# Patient Record
Sex: Male | Born: 1960
Health system: Southern US, Community
[De-identification: ages and names within clinical notes are randomized; demographics above are authoritative.]

## PROBLEM LIST (undated history)

## (undated) DIAGNOSIS — U071 COVID-19: Secondary | ICD-10-CM

## (undated) DIAGNOSIS — F32A Depression, unspecified: Secondary | ICD-10-CM

## (undated) DIAGNOSIS — T7840XA Allergy, unspecified, initial encounter: Secondary | ICD-10-CM

## (undated) DIAGNOSIS — J189 Pneumonia, unspecified organism: Secondary | ICD-10-CM

## (undated) HISTORY — DX: Depression, unspecified: F32.A

## (undated) HISTORY — DX: Allergy, unspecified, initial encounter: T78.40XA

## (undated) HISTORY — DX: COVID-19: U07.1

---

## 2008-03-03 ENCOUNTER — Emergency Department (HOSPITAL_COMMUNITY): Admission: EM | Admit: 2008-03-03 | Discharge: 2008-03-03 | Payer: Self-pay | Admitting: Emergency Medicine

## 2020-02-04 ENCOUNTER — Inpatient Hospital Stay (HOSPITAL_COMMUNITY)
Admission: EM | Admit: 2020-02-04 | Discharge: 2020-02-15 | DRG: 177 | Disposition: A | Discharge status: Home / Self Care | Payer: HRSA Program | Attending: Student in an Organized Health Care Education/Training Program | Admitting: Student in an Organized Health Care Education/Training Program

## 2020-02-04 ENCOUNTER — Emergency Department (HOSPITAL_COMMUNITY): Payer: HRSA Program

## 2020-02-04 ENCOUNTER — Other Ambulatory Visit: Payer: Self-pay

## 2020-02-04 DIAGNOSIS — Z801 Family history of malignant neoplasm of trachea, bronchus and lung: Secondary | ICD-10-CM

## 2020-02-04 DIAGNOSIS — N179 Acute kidney failure, unspecified: Secondary | ICD-10-CM | POA: Diagnosis present

## 2020-02-04 DIAGNOSIS — J1282 Pneumonia due to coronavirus disease 2019: Secondary | ICD-10-CM | POA: Diagnosis present

## 2020-02-04 DIAGNOSIS — Z283 Underimmunization status: Secondary | ICD-10-CM

## 2020-02-04 DIAGNOSIS — R0902 Hypoxemia: Secondary | ICD-10-CM

## 2020-02-04 DIAGNOSIS — E1165 Type 2 diabetes mellitus with hyperglycemia: Secondary | ICD-10-CM | POA: Diagnosis present

## 2020-02-04 DIAGNOSIS — E860 Dehydration: Secondary | ICD-10-CM | POA: Diagnosis present

## 2020-02-04 DIAGNOSIS — U071 COVID-19: Principal | ICD-10-CM | POA: Diagnosis present

## 2020-02-04 DIAGNOSIS — R1110 Vomiting, unspecified: Secondary | ICD-10-CM | POA: Diagnosis present

## 2020-02-04 DIAGNOSIS — Z803 Family history of malignant neoplasm of breast: Secondary | ICD-10-CM

## 2020-02-04 DIAGNOSIS — E119 Type 2 diabetes mellitus without complications: Secondary | ICD-10-CM

## 2020-02-04 DIAGNOSIS — T380X5A Adverse effect of glucocorticoids and synthetic analogues, initial encounter: Secondary | ICD-10-CM | POA: Diagnosis not present

## 2020-02-04 DIAGNOSIS — K5900 Constipation, unspecified: Secondary | ICD-10-CM | POA: Diagnosis not present

## 2020-02-04 DIAGNOSIS — E861 Hypovolemia: Secondary | ICD-10-CM | POA: Diagnosis present

## 2020-02-04 DIAGNOSIS — J9601 Acute respiratory failure with hypoxia: Secondary | ICD-10-CM | POA: Diagnosis present

## 2020-02-04 LAB — BASIC METABOLIC PANEL
Anion gap: 9 (ref 5–15)
BUN: 17 mg/dL (ref 6–20)
CO2: 24 mmol/L (ref 22–32)
Calcium: 8.6 mg/dL — ABNORMAL LOW (ref 8.9–10.3)
Chloride: 98 mmol/L (ref 98–111)
Creatinine, Ser: 1.25 mg/dL — ABNORMAL HIGH (ref 0.61–1.24)
GFR, Estimated: >60 mL/min (ref >60)
Glucose, Bld: 154 mg/dL — ABNORMAL HIGH (ref 70–99)
Potassium: 4.1 mmol/L (ref 3.5–5.1)
Sodium: 131 mmol/L — ABNORMAL LOW (ref 135–145)

## 2020-02-04 LAB — LACTATE DEHYDROGENASE: LDH: 273 U/L — ABNORMAL HIGH (ref 98–192)

## 2020-02-04 LAB — C-REACTIVE PROTEIN: CRP: 1.6 mg/dL — ABNORMAL HIGH (ref <1.0)

## 2020-02-04 LAB — CBC
HCT: 47.6 % (ref 39.0–52.0)
Hemoglobin: 16.5 g/dL (ref 13.0–17.0)
MCH: 30.6 pg (ref 26.0–34.0)
MCHC: 34.7 g/dL (ref 30.0–36.0)
MCV: 88.1 fL (ref 80.0–100.0)
Platelets: 102 10*3/uL — ABNORMAL LOW (ref 150–400)
RBC: 5.4 MIL/uL (ref 4.22–5.81)
RDW: 12 % (ref 11.5–15.5)
WBC: 3.4 10*3/uL — ABNORMAL LOW (ref 4.0–10.5)
nRBC: 0 % (ref 0.0–0.2)

## 2020-02-04 LAB — D-DIMER, QUANTITATIVE: D-Dimer, Quant: 0.51 ug/mL-FEU — ABNORMAL HIGH (ref 0.00–0.50)

## 2020-02-04 LAB — PROCALCITONIN: Procalcitonin: <0.1 ng/mL

## 2020-02-04 LAB — FIBRINOGEN: Fibrinogen: 596 mg/dL — ABNORMAL HIGH (ref 210–475)

## 2020-02-04 LAB — RESP PANEL BY RT-PCR (FLU A&B, COVID) ARPGX2
Influenza A by PCR: NEGATIVE
Influenza B by PCR: NEGATIVE
SARS Coronavirus 2 by RT PCR: POSITIVE — AB

## 2020-02-04 LAB — LACTIC ACID, PLASMA: Lactic Acid, Venous: 2 mmol/L (ref 0.5–1.9)

## 2020-02-04 LAB — FERRITIN: Ferritin: 1266 ng/mL — ABNORMAL HIGH (ref 24–336)

## 2020-02-04 LAB — TRIGLYCERIDES: Triglycerides: 88 mg/dL (ref <150)

## 2020-02-04 MED ORDER — ONDANSETRON HCL 4 MG/2ML IJ SOLN
4.0000 mg | Freq: Once | INTRAMUSCULAR | Status: AC
  Administered 2020-02-04: 4 mg via INTRAVENOUS
  Filled 2020-02-04: qty 2

## 2020-02-04 MED ORDER — POLYETHYLENE GLYCOL 3350 17 G PO PACK
17.0000 g | PACK | Freq: Every day | ORAL | Status: DC | PRN
  Administered 2020-02-10: 17 g via ORAL
  Filled 2020-02-04 (×2): qty 1

## 2020-02-04 MED ORDER — ACETAMINOPHEN 325 MG PO TABS
650.0000 mg | ORAL_TABLET | Freq: Once | ORAL | Status: AC
  Administered 2020-02-04: 650 mg via ORAL

## 2020-02-04 MED ORDER — LACTATED RINGERS IV SOLN: INTRAVENOUS | Status: AC

## 2020-02-04 MED ORDER — DEXAMETHASONE 6 MG PO TABS
6.0000 mg | ORAL_TABLET | Freq: Every day | ORAL | Status: AC
  Administered 2020-02-04 – 2020-02-13 (×10): 6 mg via ORAL
  Filled 2020-02-04: qty 2
  Filled 2020-02-04 (×4): qty 1
  Filled 2020-02-04: qty 2
  Filled 2020-02-04 (×4): qty 1

## 2020-02-04 MED ORDER — ACETAMINOPHEN 325 MG PO TABS
650.0000 mg | ORAL_TABLET | Freq: Four times a day (QID) | ORAL | Status: DC | PRN
  Administered 2020-02-05 – 2020-02-06 (×2): 650 mg via ORAL
  Filled 2020-02-04 (×2): qty 2

## 2020-02-04 MED ORDER — GUAIFENESIN-DM 100-10 MG/5ML PO SYRP
10.0000 mL | ORAL_SOLUTION | ORAL | Status: DC | PRN
  Administered 2020-02-09: 10 mL via ORAL
  Filled 2020-02-04 (×2): qty 10

## 2020-02-04 MED ORDER — ONDANSETRON HCL 4 MG/2ML IJ SOLN: 4.0000 mg | Freq: Four times a day (QID) | INTRAMUSCULAR | Status: DC | PRN

## 2020-02-04 MED ORDER — ONDANSETRON HCL 4 MG PO TABS: 4.0000 mg | ORAL_TABLET | Freq: Four times a day (QID) | ORAL | Status: DC | PRN

## 2020-02-04 MED ORDER — LACTATED RINGERS IV BOLUS
1000.0000 mL | Freq: Once | INTRAVENOUS | Status: AC
  Administered 2020-02-04: 1000 mL via INTRAVENOUS

## 2020-02-04 MED ORDER — HYDROCOD POLST-CPM POLST ER 10-8 MG/5ML PO SUER
5.0000 mL | Freq: Two times a day (BID) | ORAL | Status: DC | PRN
Start: 2020-02-04 — End: 2020-02-15
  Administered 2020-02-05 – 2020-02-15 (×5): 5 mL via ORAL
  Filled 2020-02-04 (×6): qty 5

## 2020-02-04 MED ORDER — ENOXAPARIN SODIUM 40 MG/0.4ML ~~LOC~~ SOLN
40.0000 mg | SUBCUTANEOUS | Status: DC
  Filled 2020-02-04 (×2): qty 0.4

## 2020-02-04 NOTE — ED Provider Notes (Signed)
Patient CARE signed out to continue to monitor and reassess. Patient presents with recurrent vomiting, weakness, fatigue, body aches. Patient 88% with ambulation, Covid positive.  IV fluids given. Blood work reviewed mild elevated kidney function, discussed with internal medicine for admission.  Updated patient on plan of care.  Kenton Kingfisher, MD 02/04/20 [phone]

## 2020-02-04 NOTE — ED Notes (Signed)
Pt O2 sats dropped to 88% while ambulating.

## 2020-02-04 NOTE — ED Triage Notes (Addendum)
Pt presents to ED BIB [facility]. Pt c/o covid+, weakness, fatigue, body aches. Pt is not vaccinated 96% RA HR - 92

## 2020-02-04 NOTE — ED Provider Notes (Signed)
[facility] EMERGENCY DEPARTMENT Provider Note   CSN: [id number] Arrival date & time: 02/04/20  0050     History Chief Complaint  Patient presents with  . Covid Positive    Raymond Lewis is a 60 y.o. male.   Shortness of Breath Severity:  Moderate Onset quality:  Gradual Timing:  Constant Progression:  Worsening Chronicity:  New Relieved by:  Nothing Worsened by:  Nothing Ineffective treatments:  None tried Associated symptoms: abdominal pain, cough, fever, headaches and vomiting   Associated symptoms: no chest pain and no rash        No past medical history on file.  Patient Active Problem List   Diagnosis Date Noted  . Pneumonia due to COVID-19 virus 02/04/2020         No family history on file.     Home Medications Prior to Admission medications   Not on File    Allergies    Patient has no known allergies.  Review of Systems   Review of Systems  Constitutional: Positive for appetite change, chills and fever.  HENT: Negative for congestion and rhinorrhea.   Respiratory: Positive for cough and shortness of breath.   Cardiovascular: Negative for chest pain and palpitations.  Gastrointestinal: Positive for abdominal pain and vomiting. Negative for diarrhea and nausea.  Genitourinary: Negative for difficulty urinating and dysuria.  Musculoskeletal: Negative for arthralgias and back pain.  Skin: Negative for color change and rash.  Neurological: Positive for headaches. Negative for light-headedness.    Physical Exam Updated Vital Signs BP (!) 105/59   Pulse 71   Temp 98.8 F (37.1 C) (Oral)   Resp (!) 23   SpO2 93%   Physical Exam Vitals and nursing note reviewed.  Constitutional:      General: He is not in acute distress.    Appearance: Normal appearance.  HENT:     Head: Normocephalic and atraumatic.     Nose: No rhinorrhea.  Eyes:     General:        Right eye: No discharge.        Left eye: No discharge.      Conjunctiva/sclera: Conjunctivae normal.  Cardiovascular:     Rate and Rhythm: Normal rate and regular rhythm.  Pulmonary:     Effort: Pulmonary effort is normal.     Breath sounds: No stridor. Wheezing and rhonchi present.  Abdominal:     General: Abdomen is flat. There is no distension.     Palpations: Abdomen is soft.  Musculoskeletal:        General: No deformity or signs of injury.  Skin:    General: Skin is warm and dry.  Neurological:     General: No focal deficit present.     Mental Status: He is alert. Mental status is at baseline.     Motor: No weakness.  Psychiatric:        Mood and Affect: Mood normal.        Behavior: Behavior normal.        Thought Content: Thought content normal.     ED Results / Procedures / Treatments   Labs (all labs ordered are listed, but only abnormal results are displayed) Labs Reviewed  RESP PANEL BY RT-PCR (FLU A&B, COVID) ARPGX2 - Abnormal; Notable for the following components:      Result Value   SARS Coronavirus 2 by RT PCR POSITIVE (*)    All other components within normal limits  CBC - Abnormal; Notable for  the following components:   WBC 3.4 (*)    Platelets 102 (*)    All other components within normal limits  BASIC METABOLIC PANEL - Abnormal; Notable for the following components:   Sodium 131 (*)    Glucose, Bld 154 (*)    Creatinine, Ser 1.25 (*)    Calcium 8.6 (*)    All other components within normal limits  LACTIC ACID, PLASMA - Abnormal; Notable for the following components:   Lactic Acid, Venous 2.0 (*)    All other components within normal limits  D-DIMER, QUANTITATIVE (NOT AT [facility]) - Abnormal; Notable for the following components:   D-Dimer, Quant 0.51 (*)    All other components within normal limits  LACTATE DEHYDROGENASE - Abnormal; Notable for the following components:   LDH 273 (*)    All other components within normal limits  FERRITIN - Abnormal; Notable for the following components:   Ferritin 1,266 (*)     All other components within normal limits  FIBRINOGEN - Abnormal; Notable for the following components:   Fibrinogen 596 (*)    All other components within normal limits  C-REACTIVE PROTEIN - Abnormal; Notable for the following components:   CRP 1.6 (*)    All other components within normal limits  CBC WITH DIFFERENTIAL/PLATELET - Abnormal; Notable for the following components:   WBC 2.1 (*)    Platelets 107 (*)    Neutro Abs 1.5 (*)    Lymphs Abs 0.3 (*)    All other components within normal limits  COMPREHENSIVE METABOLIC PANEL - Abnormal; Notable for the following components:   Sodium 134 (*)    Glucose, Bld 230 (*)    BUN 22 (*)    Calcium 8.5 (*)    Albumin 2.9 (*)    AST 74 (*)    ALT 49 (*)    All other components within normal limits  C-REACTIVE PROTEIN - Abnormal; Notable for the following components:   CRP 1.8 (*)    All other components within normal limits  D-DIMER, QUANTITATIVE (NOT AT [facility]) - Abnormal; Notable for the following components:   D-Dimer, Quant 0.73 (*)    All other components within normal limits  CULTURE, BLOOD (ROUTINE X 2)  CULTURE, BLOOD (ROUTINE X 2)  PROCALCITONIN  TRIGLYCERIDES  HIV ANTIBODY (ROUTINE TESTING W REFLEX)  LACTIC ACID, PLASMA    EKG EKG Interpretation  Date/Time:  Monday February 04 2020 14:01:08 EST Ventricular Rate:  87 PR Interval:    QRS Duration: 102 QT Interval:  352 QTC Calculation: 424 R Axis:   87 Text Interpretation: Sinus rhythm Consider left atrial enlargement Minimal ST elevation, lateral leads likley early repolarization Confirmed by Cherlynn Perches ([id number]) on 02/04/2020 2:02:51 PM   Radiology DG Chest Portable 1 View  Result Date: 02/04/2020 CLINICAL DATA:  Shortness of breath, cough, COVID-19 positive EXAM: PORTABLE CHEST 1 VIEW COMPARISON:  Portable exam 1305 hours without priors for comparison FINDINGS: Normal heart size, mediastinal contours, and pulmonary vascularity. Patchy infiltrates in the mid to  lower lungs bilaterally consistent with multifocal pneumonia and history of COVID-19. No pleural effusion or pneumothorax. Osseous structures unremarkable. IMPRESSION: BILATERAL pulmonary infiltrates consistent with multifocal pneumonia and COVID-19. Electronically Signed   By: Ulyses Southward M.D.   On: 02/04/2020 13:14    Procedures Procedures (including critical care time)  Medications Ordered in ED Medications  enoxaparin (LOVENOX) injection 40 mg (40 mg Subcutaneous Not Given 02/05/20 0117)  lactated ringers infusion ( Intravenous Stopped 02/04/20 2153)  dexamethasone (DECADRON) tablet 6 mg (6 mg Oral Given 02/04/20 1728)  guaiFENesin-dextromethorphan (ROBITUSSIN DM) 100-10 MG/5ML syrup 10 mL (has no administration in time range)  chlorpheniramine-HYDROcodone (TUSSIONEX) 10-8 MG/5ML suspension 5 mL (has no administration in time range)  acetaminophen (TYLENOL) tablet 650 mg (has no administration in time range)  polyethylene glycol (MIRALAX / GLYCOLAX) packet 17 g (has no administration in time range)  ondansetron (ZOFRAN) tablet 4 mg (has no administration in time range)    Or  ondansetron (ZOFRAN) injection 4 mg (has no administration in time range)  acetaminophen (TYLENOL) tablet 650 mg (650 mg Oral Given 02/04/20 0115)  lactated ringers bolus 1,000 mL (0 mLs Intravenous Stopped 02/04/20 1615)  ondansetron (ZOFRAN) injection 4 mg (4 mg Intravenous Given 02/04/20 1404)    ED Course  I have reviewed the triage vital signs and the nursing notes.  Pertinent labs & imaging results that were available during my care of the patient were reviewed by me and considered in my medical decision making (see chart for details).    MDM Rules/Calculators/A&P                          Patient has 7 days of URI type symptoms, positive for Covid.  Placed on nasal cannula in triage however no documented low O2 so we will discontinue and assess patient's level of hypoxia whether be there or not.  Other labs show  mild leukopenia otherwise fairly unremarkable.  Patient will get IV fluids antiemetics.  We will get a chest x-ray as well.  CXR after my review and rad review consistent with viral PNA. Other covid labs pending, need to trial pt off O2 to determine dispo.  Pt care was handed off to on coming provider at 1500.  Complete history and physical and current plan have been communicated.  Please refer to their note for the remainder of ED care and ultimate disposition.  Pt seen in conjunction with Dr. Jodi Mourning   Final Clinical Impression(s) / ED Diagnoses Final diagnoses:  Hypoxia  Pneumonia due to COVID-19 virus  Vomiting in adult    Rx / DC Orders ED Discharge Orders    None       Sabino Donovan, MD 02/05/20 [phone]

## 2020-02-04 NOTE — H&P (Addendum)
Date: 02/04/2020               Patient Name:  Raymond Lewis MRN: [id number]  DOB: 12/15/1960 Age / Sex: 60 y.o., male   PCP: Patient, No Pcp Per         Medical Service: Internal Medicine Teaching Service         Attending Physician: Dr. Oswaldo Done, Marquita Palms, *    First Contact: Dr. Judeth Cornfield Pager: [phone]  Second Contact: Dr. Karilyn Cota, Aree[place] Pager: [phone]       After Hours (After 5p/  First Contact Pager: [phone]  weekends / holidays): Second Contact Pager: [phone]   Chief Complaint: 'Pain all over'  History of Present Illness:  Raymond Lewis is a 60 yo M w/ no significant PMH presenting to [facility] with subjective fevers, myalgia, nausea, vomiting and dyspnea. He was in his usual state of health until the 23rd when he began to endorse some weakness and fatigue after seeing multiple family members locally with his wife. He mentions that he does not know if anybody he came in contact has COVID but his wife began to endorse symptoms first after the family visit and he soon developed similar symptoms. He mentions having worsening fatigue and having cough with dark yellow productive sputum over the course of the holiday season as well as worsening shortness of breath and muscle aches all over. He also mentions having subjective fevers, nausea, NBNB vomiting and episodes of loose stools as well. He mentions not having taken the COVID vaccine as he feels 'it needs more research.' He mentions not taking any medications over the counter as he tries to avoid taking medications in general. He does not have a primary care provider.  Meds: 'Does not take any medications at home'  Allergies: Allergies as of 02/04/2020   (No Known Allergies)   No past medical history, has not seen a doctor in 30 years  Family History: Mother had breast cancer. Father had lung cancer but was a heavy smoker.  Social History: Works in home improvement. Lives with his wife. Denies any alcohol, tobacco, illicit  substance use.  Review of Systems: A complete ROS was negative except as per HPI.  Physical Exam: Blood pressure 119/76, pulse 80, temperature 99.5 F (37.5 C), temperature source Oral, resp. rate 18, SpO2 92 %.  Gen: Well-developed, well-nourished, ill-appearing HEENT: NCAT head, hearing intact, MMM Neck: supple, ROM intact CV: RRR, S1, S2 normal, No rubs, no murmurs, no gallops Pulm: Bilateral lobe crackles, no wheezes, no dullness to percussion, frequent coughs  Abd: Soft, BS+, NTND, No rebound, no guarding Extm: ROM intact, Peripheral pulses intact, No peripheral edema Skin: Dry, Warm, poor turgor Neuro: AAOx3  EKG: personally reviewed my interpretation is sinus rhythm, no ischemic changes  CXR: personally reviewed my interpretation is bilateral lower lobe infiltrates, no pleural effusion, no pulmonary edema  Assessment & Plan by Problem: Principal Problem:   Pneumonia due to COVID-19 virus Active Problems:   Diabetes (HCC)   AKI (acute kidney injury) (HCC)  [name] is a 60 yo M w/ no significant PMH presenting to [facility] with acute hypoxic respiratory failure due to COVID pneumonia.  Acute respiratory failure 2/2 COVID pneumonia Present w/ acute hypoxic respiratory failure, currently 94 on room air but requiring 2L oxygen with ambulation. Inflammatory markers elevated at CRP 1.6, D-dimer 0.51. Ferritin 1266+ sick contact. Unvaccinated status. COVID +. Need admission for treatment for COVID pneumonia - Start dexamethasone (day 1/10) - Hold off on  remdesivir with minimal oxygen requirement - Trend CMP, inflammatory markers - O2 as need to keep >88 - Early ambulation, proning as tolerated, incentive spirometry - Glucose checks while on steroids  Acute Kidney Injury due to dehydration Likely in setting of COVID with poor oral intake and dehydration. No known baseline. BUN 17, Creatinine 1.25. on admission. Appear hypovolemic on exam. - LR 500cc  - Trend renal fx - Avoid  nephrotoxic meds when able  DVT prophx: Lovenox Diet: Carb modified Bowel: Miralax Code: Full  Prior to Admission Living Arrangement: Home Anticipated Discharge Location: Home Barriers to Discharge: Medical tx  Dispo: Admit patient to Inpatient with expected length of stay greater than 2 midnights.  Signed: [name], MD 02/04/2020, 4:00 PM  Pager: [phone]

## 2020-02-05 DIAGNOSIS — E861 Hypovolemia: Secondary | ICD-10-CM | POA: Diagnosis present

## 2020-02-05 DIAGNOSIS — N179 Acute kidney failure, unspecified: Secondary | ICD-10-CM | POA: Diagnosis present

## 2020-02-05 DIAGNOSIS — J1282 Pneumonia due to coronavirus disease 2019: Secondary | ICD-10-CM | POA: Diagnosis present

## 2020-02-05 DIAGNOSIS — J9601 Acute respiratory failure with hypoxia: Secondary | ICD-10-CM | POA: Diagnosis present

## 2020-02-05 DIAGNOSIS — Z801 Family history of malignant neoplasm of trachea, bronchus and lung: Secondary | ICD-10-CM | POA: Diagnosis not present

## 2020-02-05 DIAGNOSIS — E1165 Type 2 diabetes mellitus with hyperglycemia: Secondary | ICD-10-CM | POA: Diagnosis present

## 2020-02-05 DIAGNOSIS — U071 COVID-19: Secondary | ICD-10-CM | POA: Diagnosis present

## 2020-02-05 DIAGNOSIS — K5900 Constipation, unspecified: Secondary | ICD-10-CM | POA: Diagnosis not present

## 2020-02-05 DIAGNOSIS — T380X5A Adverse effect of glucocorticoids and synthetic analogues, initial encounter: Secondary | ICD-10-CM | POA: Diagnosis not present

## 2020-02-05 DIAGNOSIS — E860 Dehydration: Secondary | ICD-10-CM | POA: Diagnosis present

## 2020-02-05 DIAGNOSIS — Z803 Family history of malignant neoplasm of breast: Secondary | ICD-10-CM | POA: Diagnosis not present

## 2020-02-05 DIAGNOSIS — Z283 Underimmunization status: Secondary | ICD-10-CM | POA: Diagnosis not present

## 2020-02-05 DIAGNOSIS — R1110 Vomiting, unspecified: Secondary | ICD-10-CM | POA: Diagnosis present

## 2020-02-05 DIAGNOSIS — E119 Type 2 diabetes mellitus without complications: Secondary | ICD-10-CM

## 2020-02-05 LAB — CBC WITH DIFFERENTIAL/PLATELET
Abs Immature Granulocytes: 0.01 10*3/uL (ref 0.00–0.07)
Basophils Absolute: 0 10*3/uL (ref 0.0–0.1)
Basophils Relative: 0 %
Eosinophils Absolute: 0 10*3/uL (ref 0.0–0.5)
Eosinophils Relative: 0 %
HCT: 44.6 % (ref 39.0–52.0)
Hemoglobin: 15.4 g/dL (ref 13.0–17.0)
Immature Granulocytes: 1 %
Lymphocytes Relative: 16 %
Lymphs Abs: 0.3 10*3/uL — ABNORMAL LOW (ref 0.7–4.0)
MCH: 30.4 pg (ref 26.0–34.0)
MCHC: 34.5 g/dL (ref 30.0–36.0)
MCV: 88 fL (ref 80.0–100.0)
Monocytes Absolute: 0.2 10*3/uL (ref 0.1–1.0)
Monocytes Relative: 11 %
Neutro Abs: 1.5 10*3/uL — ABNORMAL LOW (ref 1.7–7.7)
Neutrophils Relative %: 72 %
Platelets: 107 10*3/uL — ABNORMAL LOW (ref 150–400)
RBC: 5.07 MIL/uL (ref 4.22–5.81)
RDW: 12 % (ref 11.5–15.5)
WBC: 2.1 10*3/uL — ABNORMAL LOW (ref 4.0–10.5)
nRBC: 0 % (ref 0.0–0.2)

## 2020-02-05 LAB — C-REACTIVE PROTEIN: CRP: 1.8 mg/dL — ABNORMAL HIGH (ref <1.0)

## 2020-02-05 LAB — D-DIMER, QUANTITATIVE: D-Dimer, Quant: 0.73 ug/mL-FEU — ABNORMAL HIGH (ref 0.00–0.50)

## 2020-02-05 LAB — CBG MONITORING, ED
Glucose-Capillary: 222 mg/dL — ABNORMAL HIGH (ref 70–99)
Glucose-Capillary: 170 mg/dL — ABNORMAL HIGH (ref 70–99)
Glucose-Capillary: 252 mg/dL — ABNORMAL HIGH (ref 70–99)

## 2020-02-05 LAB — COMPREHENSIVE METABOLIC PANEL
ALT: 49 U/L — ABNORMAL HIGH (ref 0–44)
AST: 74 U/L — ABNORMAL HIGH (ref 15–41)
Albumin: 2.9 g/dL — ABNORMAL LOW (ref 3.5–5.0)
Alkaline Phosphatase: 52 U/L (ref 38–126)
Anion gap: 12 (ref 5–15)
BUN: 22 mg/dL — ABNORMAL HIGH (ref 6–20)
CO2: 24 mmol/L (ref 22–32)
Calcium: 8.5 mg/dL — ABNORMAL LOW (ref 8.9–10.3)
Chloride: 98 mmol/L (ref 98–111)
Creatinine, Ser: 1.08 mg/dL (ref 0.61–1.24)
GFR, Estimated: >60 mL/min (ref >60)
Glucose, Bld: 230 mg/dL — ABNORMAL HIGH (ref 70–99)
Potassium: 4.6 mmol/L (ref 3.5–5.1)
Sodium: 134 mmol/L — ABNORMAL LOW (ref 135–145)
Total Bilirubin: 1.1 mg/dL (ref 0.3–1.2)
Total Protein: 7 g/dL (ref 6.5–8.1)

## 2020-02-05 LAB — HEMOGLOBIN A1C
Hgb A1c MFr Bld: 7.8 % — ABNORMAL HIGH (ref 4.8–5.6)
Mean Plasma Glucose: 177.16 mg/dL

## 2020-02-05 MED ORDER — INSULIN ASPART 100 UNIT/ML ~~LOC~~ SOLN
0.0000 [IU] | Freq: Three times a day (TID) | SUBCUTANEOUS | Status: DC
  Administered 2020-02-05: 8 [IU] via SUBCUTANEOUS
  Administered 2020-02-05: 5 [IU] via SUBCUTANEOUS
  Administered 2020-02-06 (×2): 3 [IU] via SUBCUTANEOUS
  Administered 2020-02-06 – 2020-02-07 (×2): 5 [IU] via SUBCUTANEOUS
  Administered 2020-02-07: 8 [IU] via SUBCUTANEOUS
  Administered 2020-02-08: 3 [IU] via SUBCUTANEOUS

## 2020-02-05 NOTE — ED Notes (Signed)
Tele  Breakfast Ordered 

## 2020-02-05 NOTE — ED Notes (Signed)
Pts SpO2 on RA was 86% on ambulation

## 2020-02-05 NOTE — Progress Notes (Signed)
Pt refused blood thinner, attempted to educated pt on the increased chance of blood clots with COVID, pt unsure if he wants it, will pass on to daytime RN

## 2020-02-05 NOTE — ED Notes (Signed)
Dinner Tray Ordered @ 1713. 

## 2020-02-05 NOTE — Progress Notes (Signed)
   Subjective:  Raymond Lewis is a 60 y.o.M with no significant PMH admit for COVID pneumonia on hospital day 0  Raymond Lewis was examined and evaluated at bedside this am. He mentions continuing to endorse significant frequent coughs. He attempted to walk to bathroom this morning and was noted to feel very weak and out of breath with exertion. Endorsing some chest pain associated with cough.  Objective:  Vital signs in last 24 hours: Vitals:   02/05/20 0012 02/05/20 0400 02/05/20 0500 02/05/20 0600  BP: 92/60 96/68 108/65 (!) 105/59  Pulse: 82 71 76 71  Resp: 17 (!) 26 (!) 22 (!) 23  Temp:  98.8 F (37.1 C)    TempSrc:  Oral    SpO2: 98% 94% 91% 93%   Gen: Well-developed, ill-appearing HEENT: NCAT head, hearing intact, MMM Neck: supple, ROM intact CV: RRR, S1, S2 normal, No rubs, no murmurs, no gallops Pulm: CTAB, No rales, no wheezes, frequent coughs Extm: ROM intact, Peripheral pulses intact, No peripheral edema Skin: Dry, Warm, normal turgor Neuro: AAOx3  Assessment/Plan:  Active Problems:   Pneumonia due to COVID-19 virus  Raymond Lewis is a 60 yo M w/ no significant PMH presenting to [facility] with acute hypoxic respiratory failure due to COVID pneumonia.  Acute respiratory failure 2/2 COVID pneumonia Present w/ acute hypoxic respiratory failure, requiring 2L oxygen with ambulation on admission. Overnight episode of desaturation down to 89. This am noted to further down-trend to 82 during evaluation. Inflammatory markers rising CRP 1.6 -> 1.8, D-dimer 0.51 ->0.73 Would benefit from further inpatient stay with new diagnosis of diabetes putting him at high risk for decompensation. - C/w dexamethasone (day 2/10) - Hold off on remdesivir until oxygen requirement worsen significantly - Trend CMP, inflammatory markers - O2 as need to keep >88 - Early ambulation, proning as tolerated, incentive spirometry - Glucose checks while on steroids  Acute Kidney Injury due to  dehydration Likely in setting of COVID with poor oral intake and dehydration. No known baseline. Improved with fluids. Creatinine 1.25->1.08 - Resolved  Hyperglycemia Diabetes Mellitus On admission noted to have cbg of 150. Hgb a1c 7.8. Appear to be new diagnosis. Am cbg >200 with steroids. Need tight glucose control in setting of COVID infection - Need weight for weight-based starting insulin dose - SSI - Glucose checks  DVT prophx: Lovenox Diet: Carb modified Bowel: Miralax Code: Full  Prior to Admission Living Arrangement: Home Anticipated Discharge Location: Home Barriers to Discharge: Medical tx Dispo: Anticipated discharge in approximately 1-2 day(s).   [name], MD 02/05/2020, 7:09 AM Pager: [phone] After 5pm on weekdays and 1pm on weekends: On Call Pager: [phone]

## 2020-02-06 LAB — COMPREHENSIVE METABOLIC PANEL
ALT: 50 U/L — ABNORMAL HIGH (ref 0–44)
AST: 50 U/L — ABNORMAL HIGH (ref 15–41)
Albumin: 3.3 g/dL — ABNORMAL LOW (ref 3.5–5.0)
Alkaline Phosphatase: 63 U/L (ref 38–126)
Anion gap: 13 (ref 5–15)
BUN: 25 mg/dL — ABNORMAL HIGH (ref 6–20)
CO2: 23 mmol/L (ref 22–32)
Calcium: 9 mg/dL (ref 8.9–10.3)
Chloride: 99 mmol/L (ref 98–111)
Creatinine, Ser: 0.94 mg/dL (ref 0.61–1.24)
GFR, Estimated: >60 mL/min (ref >60)
Glucose, Bld: 206 mg/dL — ABNORMAL HIGH (ref 70–99)
Potassium: 4 mmol/L (ref 3.5–5.1)
Sodium: 135 mmol/L (ref 135–145)
Total Bilirubin: 1.2 mg/dL (ref 0.3–1.2)
Total Protein: 7.7 g/dL (ref 6.5–8.1)

## 2020-02-06 LAB — CBC WITH DIFFERENTIAL/PLATELET
Abs Immature Granulocytes: 0.02 10*3/uL (ref 0.00–0.07)
Basophils Absolute: 0 10*3/uL (ref 0.0–0.1)
Basophils Relative: 0 %
Eosinophils Absolute: 0 10*3/uL (ref 0.0–0.5)
Eosinophils Relative: 0 %
HCT: 46.3 % (ref 39.0–52.0)
Hemoglobin: 17.2 g/dL — ABNORMAL HIGH (ref 13.0–17.0)
Immature Granulocytes: 1 %
Lymphocytes Relative: 10 %
Lymphs Abs: 0.4 10*3/uL — ABNORMAL LOW (ref 0.7–4.0)
MCH: 32.1 pg (ref 26.0–34.0)
MCHC: 37.1 g/dL — ABNORMAL HIGH (ref 30.0–36.0)
MCV: 86.4 fL (ref 80.0–100.0)
Monocytes Absolute: 0.3 10*3/uL (ref 0.1–1.0)
Monocytes Relative: 9 %
Neutro Abs: 3.2 10*3/uL (ref 1.7–7.7)
Neutrophils Relative %: 80 %
Platelets: 130 10*3/uL — ABNORMAL LOW (ref 150–400)
RBC: 5.36 MIL/uL (ref 4.22–5.81)
RDW: 12 % (ref 11.5–15.5)
WBC: 3.9 10*3/uL — ABNORMAL LOW (ref 4.0–10.5)
nRBC: 0 % (ref 0.0–0.2)

## 2020-02-06 LAB — GLUCOSE, CAPILLARY
Glucose-Capillary: 140 mg/dL — ABNORMAL HIGH (ref 70–99)
Glucose-Capillary: 158 mg/dL — ABNORMAL HIGH (ref 70–99)
Glucose-Capillary: 148 mg/dL — ABNORMAL HIGH (ref 70–99)
Glucose-Capillary: 233 mg/dL — ABNORMAL HIGH (ref 70–99)

## 2020-02-06 LAB — C-REACTIVE PROTEIN: CRP: 1.3 mg/dL — ABNORMAL HIGH (ref <1.0)

## 2020-02-06 LAB — D-DIMER, QUANTITATIVE: D-Dimer, Quant: 0.53 ug/mL-FEU — ABNORMAL HIGH (ref 0.00–0.50)

## 2020-02-06 MED ORDER — INSULIN GLARGINE 100 UNIT/ML ~~LOC~~ SOLN
10.0000 [IU] | Freq: Every day | SUBCUTANEOUS | Status: DC
  Administered 2020-02-06 – 2020-02-07 (×2): 10 [IU] via SUBCUTANEOUS
  Filled 2020-02-06 (×3): qty 0.1

## 2020-02-06 MED ORDER — INSULIN ASPART 100 UNIT/ML ~~LOC~~ SOLN
3.0000 [IU] | Freq: Three times a day (TID) | SUBCUTANEOUS | Status: AC
  Administered 2020-02-06 – 2020-02-07 (×3): 3 [IU] via SUBCUTANEOUS

## 2020-02-06 NOTE — Progress Notes (Signed)
Pt's daughter Morrie Sheldon) would like to be contacted in regards to her father's POC. Phone number is [phone]

## 2020-02-06 NOTE — Progress Notes (Signed)
Pt refused lovenox, after reading note from MD (pt states he didn't speak to a doctor today). Pt also states he does not wish to be on any blood thinners at this time, at all.

## 2020-02-06 NOTE — Progress Notes (Addendum)
   Subjective:  Raymond Lewis is a 60 y.o.M with no significant PMH admit for COVID pneumonia on hospital day 1  Raymond Lewis was examined and evaluated at bedside this am. Mentions continuing to feel ill with frequent coughs and muscle aches. Mentions not putting oxygen on overnight. During evaluation noted to have desaturation event down to 86 while conversing, put on 4L o2 [place]  Objective:  Vital signs in last 24 hours: Vitals:   02/05/20 2000 02/05/20 2100 02/05/20 2200 02/05/20 2300  BP: 122/76 106/64    Pulse: 70  71 68  Resp: (!) 24 (!) 25    Temp:      TempSrc:      SpO2: 90%  90% 93%  Weight:    89.5 kg   Gen: Well-developed, ill-appearing, NAD HEENT: NCAT head, hearing intact, EOMI, MMM Pulm: Bibasilar crackles, no wheezes, frequent coughs Extm: ROM intact, No peripheral edema Skin: Dry, Warm, normal turgor  Neuro: AAOx3, Answers questions appropriately Psych: Normal mood and affect   Assessment/Plan:  Principal Problem:   Pneumonia due to COVID-19 virus Active Problems:   Diabetes (HCC)   AKI (acute kidney injury) (HCC)  Raymond Lewis is a 60 yo M w/ no significant PMH presenting to [facility] with acute hypoxic respiratory failure due to COVID pneumonia.   Acute respiratory failure 2/2 COVID pneumonia Present w/ acute hypoxic respiratory failure, requiring 2L oxygen with ambulation on admission. Having persistent oxygen requirement despite improvement in inflammatory markers. CRP 1.8->1.3, D-dimer 0.73->0.53 . Hopefully his oxygen requirement does not worsen over the next 24-48 hours. May need initiation of baricitinib/remdesivir if worsening respiratory failure - C/w dexamethasone (day 3/10) - O2 as need to keep >88 - Early ambulation, proning as tolerated, incentive spirometry   Hyperglycemia Diabetes Mellitus On admission noted to have cbg of 150. Hgb a1c 7.8. Appear to be new diagnosis. Am cbg 158 with steroids. Need tight glucose control in setting of COVID infection.  Total 19 unit insulin yesterday - Start lantus 10 units qhs - Novolog 3 units TID qc - SSI - Glucose checks  DVT prophx: Lovenox discussed with patient today, he agrees to it Diet: Carb modified Bowel: Miralax Code: Full  Prior to Admission Living Arrangement: Home Anticipated Discharge Location: Home Barriers to Discharge: Medical tx Dispo: Anticipated discharge in approximately 1-2 day(s).   [name], MD 02/06/2020, 6:42 AM  Pager: [phone] After 5pm on weekdays and 1pm on weekends: On Call Pager: [phone]

## 2020-02-06 NOTE — Progress Notes (Signed)
Pt spent the majority of the shift sleeping. Pt's appetite is poor but oral intake is fair. Pt's needs are met and safety & isolation  measures are maintained.

## 2020-02-07 LAB — GLUCOSE, CAPILLARY
Glucose-Capillary: 187 mg/dL — ABNORMAL HIGH (ref 70–99)
Glucose-Capillary: 216 mg/dL — ABNORMAL HIGH (ref 70–99)
Glucose-Capillary: 277 mg/dL — ABNORMAL HIGH (ref 70–99)
Glucose-Capillary: 228 mg/dL — ABNORMAL HIGH (ref 70–99)

## 2020-02-07 LAB — C-REACTIVE PROTEIN: CRP: 2.4 mg/dL — ABNORMAL HIGH (ref <1.0)

## 2020-02-07 LAB — CBC WITH DIFFERENTIAL/PLATELET
Abs Immature Granulocytes: 0.03 10*3/uL (ref 0.00–0.07)
Basophils Absolute: 0 10*3/uL (ref 0.0–0.1)
Basophils Relative: 0 %
Eosinophils Absolute: 0 10*3/uL (ref 0.0–0.5)
Eosinophils Relative: 0 %
HCT: 46.7 % (ref 39.0–52.0)
Hemoglobin: 17.1 g/dL — ABNORMAL HIGH (ref 13.0–17.0)
Immature Granulocytes: 1 %
Lymphocytes Relative: 9 %
Lymphs Abs: 0.4 10*3/uL — ABNORMAL LOW (ref 0.7–4.0)
MCH: 31.5 pg (ref 26.0–34.0)
MCHC: 36.6 g/dL — ABNORMAL HIGH (ref 30.0–36.0)
MCV: 86.2 fL (ref 80.0–100.0)
Monocytes Absolute: 0.3 10*3/uL (ref 0.1–1.0)
Monocytes Relative: 7 %
Neutro Abs: 3.5 10*3/uL (ref 1.7–7.7)
Neutrophils Relative %: 83 %
Platelets: 154 10*3/uL (ref 150–400)
RBC: 5.42 MIL/uL (ref 4.22–5.81)
RDW: 11.9 % (ref 11.5–15.5)
WBC: 4.2 10*3/uL (ref 4.0–10.5)
nRBC: 0 % (ref 0.0–0.2)

## 2020-02-07 LAB — COMPREHENSIVE METABOLIC PANEL
ALT: 46 U/L — ABNORMAL HIGH (ref 0–44)
AST: 36 U/L (ref 15–41)
Albumin: 3.1 g/dL — ABNORMAL LOW (ref 3.5–5.0)
Alkaline Phosphatase: 62 U/L (ref 38–126)
Anion gap: 13 (ref 5–15)
BUN: 21 mg/dL — ABNORMAL HIGH (ref 6–20)
CO2: 23 mmol/L (ref 22–32)
Calcium: 9 mg/dL (ref 8.9–10.3)
Chloride: 100 mmol/L (ref 98–111)
Creatinine, Ser: 0.9 mg/dL (ref 0.61–1.24)
GFR, Estimated: >60 mL/min (ref >60)
Glucose, Bld: 197 mg/dL — ABNORMAL HIGH (ref 70–99)
Potassium: 4.3 mmol/L (ref 3.5–5.1)
Sodium: 136 mmol/L (ref 135–145)
Total Bilirubin: 1.3 mg/dL — ABNORMAL HIGH (ref 0.3–1.2)
Total Protein: 7.3 g/dL (ref 6.5–8.1)

## 2020-02-07 LAB — D-DIMER, QUANTITATIVE: D-Dimer, Quant: 0.48 ug/mL-FEU (ref 0.00–0.50)

## 2020-02-07 MED ORDER — LIVING WELL WITH DIABETES BOOK
Freq: Once | Status: AC
  Filled 2020-02-07: qty 1

## 2020-02-07 MED ORDER — DEXTROSE-NACL 5-0.45 % IV SOLN: INTRAVENOUS | Status: DC

## 2020-02-07 MED ORDER — SODIUM CHLORIDE 0.45 % IV SOLN: INTRAVENOUS | Status: AC

## 2020-02-07 MED ORDER — RIVAROXABAN 10 MG PO TABS
10.0000 mg | ORAL_TABLET | Freq: Every day | ORAL | Status: DC
  Administered 2020-02-07 – 2020-02-15 (×9): 10 mg via ORAL
  Filled 2020-02-07 (×9): qty 1

## 2020-02-07 NOTE — Plan of Care (Signed)

## 2020-02-07 NOTE — Progress Notes (Signed)
Patient attempted to get out of bed on RA. sats dropped to 79%. Not ambulated at this time. Placed on oxygen at 2L.

## 2020-02-07 NOTE — Consult Note (Signed)
Raymond Lewis is a 60 year old male presenting to the emergency department with acute hypoxic respiratory failure due to SARS-CoV-2 infection. He has no past medical history and does not take any medications at home. He received dexamethasone 6mg  tablet orally and declined enoxaparin (Lovenox) subcutaneously  for DVT prophylaxis. Dr. spoke to the patient in rounds today (02/07/2020) about rivaroxaban (Xarelto) as an oral alternative to  Lovenox for prophylaxis against VTE. I acknowledged and agreed that Xarelto is an option even though it was not studied in SARS-CoV-2 infected patients in  the original pivotal clinical trial for which the drug was approved. However, the same type of patient, i.e., hospitalized medially ill patients with one or more inflammatory conditions or disease state(s) are present in our patient. In the MAGELLAN trial, Xarelto 10mg  once daily showed VTE risk reduction to acutely ill medical patients at risk for a VTE in inpatient setting. Raymond Lewis does not meet any exclusion criteria that lead to a high risk of major bleeding for Xarelto use: history of bronchiectasis, pulmonary cavitation or pulmonary hemorrhage; active cancer; active gastroduodenal ulcer within 3 months, history of bleeding within 3 months; or receiving dual antiplatelet therapy. Lab values today before initiating Xarelto: elevated Hgb 17.1, Hct 46.7, Plt 154, normal renal function (SCr 0.9), and mildly elevated ALT 46. The patient, with informed and shared decision making accepted the recommendation to commence rivaroxaban/Xarelto, 10mg  by mouth, once daily. Thank you for the opportunity to be a part of your patient's care.  04/06/2020, PharmD Candidate

## 2020-02-07 NOTE — Progress Notes (Addendum)
   Subjective:   Raymond Lewis states he is feeling worse today, with additional generalized weakness, dizziness. He was scared he would fall when he stood up earlier. He is having a hard time keeping up with water intake.   Objective:  Vital signs in last 24 hours: Vitals:   02/06/20 0845 02/06/20 1042 02/06/20 1525 02/06/20 2304  BP: 127/77 103/60 125/84   Pulse: 87 90 80   Resp: (!) 27 (!) 25 (!) 21 18  Temp: 100 F (37.8 C) 99.1 F (37.3 C) 98.1 F (36.7 C) 98.1 F (36.7 C)  TempSrc: Oral Oral Oral Oral  SpO2: 94% 95% 94% 100%  Weight:       Gen: Well-developed, ill-appearing, NAD Card: RRR, no murmurs  Pulm: Bibasilar crackles, no wheezes, frequent coughs Skin: Dry, Warm, normal turgor  Neuro: AAOx3, Answers questions appropriately Psych: Normal mood and affect   Assessment/Plan:  Principal Problem:   Pneumonia due to COVID-19 virus Active Problems:   Diabetes (HCC)   AKI (acute kidney injury) (HCC)  Raymond Lewis is a 60 yo M w/ no significant PMH presenting to [facility] with acute hypoxic respiratory failure due to COVID pneumonia.   #Acute hypoxic respiratory failure due to COVID-19 Pneumonia Inflammatory markers improving. Continues to have hypoxia with exertion and rest. He may require oxygen on discharge.   - C/w dexamethasone (day 4/10) - O2 as need to keep >88 - Early ambulation, proning as tolerated, incentive spirometry   # Diabetes Mellitus, Type 2 Hgb a1c 7.8% on admission. CBGs exacerbated by steroid use on admission. Will remove scheduled short acting insulin, as first dose of Lantus given last night.   - Continue lantus 10 units qhs - SSI - Glucose checks  # Generalized Weakness Likely due to poor PO intake with acute illness.  - 1/2 NS @ 100 cc/hr for 10 hours    DVT prophx: Patient declined lovenox again, will try xarelto Diet: Carb modified Bowel: Miralax Code: Full  Prior to Admission Living Arrangement: Home Anticipated Discharge Location:  Home Barriers to Discharge: Medical tx Dispo: Anticipated discharge in approximately 1-2 day(s).   Dr. Verdene Lennert Internal Medicine PGY-2  Pager: [phone] After 5pm on weekdays and 1pm on weekends: On Call pager [phone]  02/07/2020, 6:20 AM

## 2020-02-07 NOTE — Progress Notes (Signed)
Pt refused am cbg, stating someone had gotten it already, but no record of cbg in chart. MD aware.

## 2020-02-07 NOTE — Progress Notes (Signed)
satSATURATION QUALIFICATIONS: (This note is used to comply with regulatory documentation for home oxygen)  Patient Saturations on Room Air at Rest = 85%  Patient Saturations on Room Air while attempting to get out of bed  = 79%  Patient Saturations on 2Liters of oxygen while resting  = 89%

## 2020-02-07 NOTE — Progress Notes (Signed)
Patient refused blood sugar check. Insulin held.

## 2020-02-08 LAB — C-REACTIVE PROTEIN: CRP: 2 mg/dL — ABNORMAL HIGH (ref <1.0)

## 2020-02-08 LAB — CBC WITH DIFFERENTIAL/PLATELET
Abs Immature Granulocytes: 0.02 10*3/uL (ref 0.00–0.07)
Basophils Absolute: 0 10*3/uL (ref 0.0–0.1)
Basophils Relative: 0 %
Eosinophils Absolute: 0 10*3/uL (ref 0.0–0.5)
Eosinophils Relative: 0 %
HCT: 45.5 % (ref 39.0–52.0)
Hemoglobin: 16.1 g/dL (ref 13.0–17.0)
Immature Granulocytes: 0 %
Lymphocytes Relative: 8 %
Lymphs Abs: 0.4 10*3/uL — ABNORMAL LOW (ref 0.7–4.0)
MCH: 31.4 pg (ref 26.0–34.0)
MCHC: 35.4 g/dL (ref 30.0–36.0)
MCV: 88.9 fL (ref 80.0–100.0)
Monocytes Absolute: 0.4 10*3/uL (ref 0.1–1.0)
Monocytes Relative: 10 %
Neutro Abs: 3.7 10*3/uL (ref 1.7–7.7)
Neutrophils Relative %: 82 %
Platelets: 199 10*3/uL (ref 150–400)
RBC: 5.12 MIL/uL (ref 4.22–5.81)
RDW: 11.9 % (ref 11.5–15.5)
WBC: 4.5 10*3/uL (ref 4.0–10.5)
nRBC: 0 % (ref 0.0–0.2)

## 2020-02-08 LAB — COMPREHENSIVE METABOLIC PANEL
ALT: 38 U/L (ref 0–44)
AST: 26 U/L (ref 15–41)
Albumin: 3 g/dL — ABNORMAL LOW (ref 3.5–5.0)
Alkaline Phosphatase: 57 U/L (ref 38–126)
Anion gap: 13 (ref 5–15)
BUN: 21 mg/dL — ABNORMAL HIGH (ref 6–20)
CO2: 24 mmol/L (ref 22–32)
Calcium: 9.1 mg/dL (ref 8.9–10.3)
Chloride: 100 mmol/L (ref 98–111)
Creatinine, Ser: 0.88 mg/dL (ref 0.61–1.24)
GFR, Estimated: >60 mL/min (ref >60)
Glucose, Bld: 225 mg/dL — ABNORMAL HIGH (ref 70–99)
Potassium: 4.2 mmol/L (ref 3.5–5.1)
Sodium: 137 mmol/L (ref 135–145)
Total Bilirubin: 1.3 mg/dL — ABNORMAL HIGH (ref 0.3–1.2)
Total Protein: 7.5 g/dL (ref 6.5–8.1)

## 2020-02-08 LAB — GLUCOSE, CAPILLARY
Glucose-Capillary: 194 mg/dL — ABNORMAL HIGH (ref 70–99)
Glucose-Capillary: 176 mg/dL — ABNORMAL HIGH (ref 70–99)
Glucose-Capillary: 240 mg/dL — ABNORMAL HIGH (ref 70–99)
Glucose-Capillary: 285 mg/dL — ABNORMAL HIGH (ref 70–99)
Glucose-Capillary: 339 mg/dL — ABNORMAL HIGH (ref 70–99)

## 2020-02-08 LAB — D-DIMER, QUANTITATIVE: D-Dimer, Quant: <0.27 ug/mL-FEU (ref 0.00–0.50)

## 2020-02-08 MED ORDER — INSULIN ASPART 100 UNIT/ML ~~LOC~~ SOLN
0.0000 [IU] | Freq: Three times a day (TID) | SUBCUTANEOUS | Status: DC
  Administered 2020-02-08: 5 [IU] via SUBCUTANEOUS
  Administered 2020-02-08: 3 [IU] via SUBCUTANEOUS
  Administered 2020-02-08: 8 [IU] via SUBCUTANEOUS
  Administered 2020-02-09: 3 [IU] via SUBCUTANEOUS
  Administered 2020-02-09: 11 [IU] via SUBCUTANEOUS
  Administered 2020-02-10: 3 [IU] via SUBCUTANEOUS
  Administered 2020-02-10: 11 [IU] via SUBCUTANEOUS
  Administered 2020-02-10: 3 [IU] via SUBCUTANEOUS

## 2020-02-08 MED ORDER — SODIUM CHLORIDE 0.45 % IV SOLN: INTRAVENOUS | Status: AC

## 2020-02-08 MED ORDER — INSULIN GLARGINE 100 UNIT/ML ~~LOC~~ SOLN
15.0000 [IU] | Freq: Every day | SUBCUTANEOUS | Status: DC
  Administered 2020-02-08 – 2020-02-09 (×2): 15 [IU] via SUBCUTANEOUS
  Filled 2020-02-08 (×3): qty 0.15

## 2020-02-08 NOTE — Progress Notes (Signed)
Around 2100 pt desated to 79% at rest. RN came to bedside increased O2 to 6L [place] and encouraged deep breathing and coughing. Pt intialy refused to deep breath or cough. A nonrebreather was used until a HFNC was available. On Nonrebreather patient recovered to 98% SpO2 and then continued to sat 93% once switched to HFNC. Pt was eventually agreeable to chest PT and was performed for 5-10 min. RN attempted to wean O2 to 13L O2 which caused a drop in SpO2 to 85%. Pt returned to 15L HFNC.

## 2020-02-08 NOTE — Progress Notes (Addendum)
   Subjective:   Patient states they keep telling him to lay on his back. He is tolerating oral intake. He states his oxygen isn't set right. He states he could use longer tubing.   Decreased to 10L during examination and he continued saturating >94%.   Objective:  Vital signs in last 24 hours: Vitals:   02/07/20 2300 02/08/20 0000 02/08/20 0100 02/08/20 0536  BP:    131/66  Pulse:    (!) 59  Resp:    19  Temp:    98.5 F (36.9 C)  TempSrc:    Oral  SpO2: 96% 99%  100%  Weight:   87.2 kg    Gen: Well-developed, ill-appearing, NAD Card: RRR, no murmurs  Pulm: Bibasilar crackles, no wheezes, less coughing today during examination Skin: Dry, Warm, normal turgor  Neuro: AAOx3, Answers questions appropriately Psych: Normal mood and affect   Assessment/Plan:  Principal Problem:   Pneumonia due to COVID-19 virus Active Problems:   Diabetes (HCC)   AKI (acute kidney injury) (HCC)  Raymond Lewis is a 60 yo M w/ no significant PMH presenting to [facility] with acute hypoxic respiratory failure due to COVID pneumonia.   #Acute hypoxic respiratory failure due to COVID-19 Pneumonia Worsened hypoxia overnight, however inflammatory makers are close to normalizing. Low suspicion for PE given normal D-dimer. Unlikely to have superimposed bacteria pneumonia as he is afebrile and no leukocytosis. Will continue to monitor closely for now. Will hold off on Baricitinib given CRP is 2.   - Continue Dexamethasone (day 5/10) - O2 as need to keep >92% - Early ambulation, proning as tolerated, incentive spirometry   # Diabetes Mellitus, Type 2 Hgb a1c 7.8% on admission. CBGs exacerbated by steroid use.   - Increase Lantus to 15 units qhs - SSI - Glucose checks - Plan to start Metformin on discharge   # Generalized Weakness Likely due to poor PO intake with acute illness. He feels better after receiving fluids so will give more today.   - 1/2 NS @ 50 cc/hr for 20 hours    DVT prophx:  Xarelto Diet: Carb modified Bowel: Miralax Code: Full  Prior to Admission Living Arrangement: Home Anticipated Discharge Location: Home Barriers to Discharge: Ongoing hypoxia Dispo: Anticipated discharge in approximately 1-2 day(s).   Dr. Verdene Lennert Internal Medicine PGY-2  Pager: [phone] After 5pm on weekdays and 1pm on weekends: On Call pager [phone]  02/08/2020, 6:46 AM

## 2020-02-08 NOTE — Progress Notes (Signed)
Pt c/o pain that are in the lower extremities.No pharmacological intervention was administered. Pt's appetite is fair and oral intake as well. Pt's needs are met and safety & isolation measures are maintained. VSS

## 2020-02-09 DIAGNOSIS — R0902 Hypoxemia: Secondary | ICD-10-CM

## 2020-02-09 LAB — BASIC METABOLIC PANEL
Anion gap: 12 (ref 5–15)
BUN: 15 mg/dL (ref 6–20)
CO2: 25 mmol/L (ref 22–32)
Calcium: 8.8 mg/dL — ABNORMAL LOW (ref 8.9–10.3)
Chloride: 98 mmol/L (ref 98–111)
Creatinine, Ser: 0.77 mg/dL (ref 0.61–1.24)
GFR, Estimated: >60 mL/min (ref >60)
Glucose, Bld: 166 mg/dL — ABNORMAL HIGH (ref 70–99)
Potassium: 4.2 mmol/L (ref 3.5–5.1)
Sodium: 135 mmol/L (ref 135–145)

## 2020-02-09 LAB — CULTURE, BLOOD (ROUTINE X 2)
Culture: NO GROWTH
Special Requests: ADEQUATE

## 2020-02-09 LAB — GLUCOSE, CAPILLARY
Glucose-Capillary: 199 mg/dL — ABNORMAL HIGH (ref 70–99)
Glucose-Capillary: 308 mg/dL — ABNORMAL HIGH (ref 70–99)
Glucose-Capillary: 264 mg/dL — ABNORMAL HIGH (ref 70–99)

## 2020-02-09 NOTE — Progress Notes (Signed)
Pt refusing labs this AM. Pt verbalizing frustration over not having a "set schedule" for all care and being woken up for vitals and labs etc. RN previously explained and reiterated to patient the closest possible vitals/assessment schedule which patient states that he is unsatisfied with and said to RN "you can just get out of my room". Patient continues to refuse labs and nursing care.

## 2020-02-09 NOTE — Progress Notes (Addendum)
This RN went into pt's room to fix beeping IV. RN also obtained vital signs, urine output, and weight (pt refused standing weight). RN also fixed 2 cardiac leads, which had fallen off the patient. Pt voiced frustration with being awakened, but did not refuse care. RN explained that the initial reason for entering pt's room was due to the beeping IV. Also explained to the pt that nursing care was being grouped to maximize efficiency in an isolation room, as pt gets irritated when "bothered" too many times by multidisciplinary hospital staff. Pt requested this RN's name and to repeat his values so that he could record them on his phone. Phlebotomy entered room after this RN left. Pt apparently refused morning labs.

## 2020-02-09 NOTE — Progress Notes (Signed)
° °  Subjective:   Raymond Lewis reports he is feeling frustrated with his slow recovery. He is hoping to be given an incentive spirometer to help with his breathing. Otherwise, he denies any other acute complaints.   Objective:  Vital signs in last 24 hours: Vitals:   02/08/20 1632 02/08/20 2132 02/08/20 2324 02/09/20 0324  BP:  129/66  119/70  Pulse:  71    Resp:  19    Temp: 97.9 F (36.6 C) 98.3 F (36.8 C)  97.9 F (36.6 C)  TempSrc: Oral Oral  Oral  SpO2:  93% 97% 94%  Weight:    87.9 kg   Gen: Well-developed, ill-appearing, NAD Card: RRR, no murmurs  Pulm: Bibasilar crackles, no wheezes. No respiratory distress or increased WOB. No accessory muscle use.  Skin: Dry, Warm, normal turgor  Neuro: AAOx3, Answers questions appropriately Psych: Normal mood and affect   Assessment/Plan:  Principal Problem:   Pneumonia due to COVID-19 virus Active Problems:   Diabetes (HCC)   AKI (acute kidney injury) (HCC)  Raymond Lewis is a 60 yo M w/ no significant PMH presenting to [facility] with acute hypoxic respiratory failure due to COVID pneumonia.   # Acute hypoxic respiratory failure due to COVID-19 Pneumonia Patient continues to have significant hypoxia that is quite exacerbated by motion as patient drop to the 70s with getting out of bed to his chair. At rest, supplemental oxygen needs are stable. No medication changes at this time. We requested RN contact RT to provide patient with IC and FV.   - Continue Dexamethasone (day 6/10) - O2 as need to keep >92% - Early ambulation, proning as tolerated, incentive spirometry, flutter valve   # Diabetes Mellitus, Type 2 Hgb a1c 7.8% on admission. CBGs exacerbated by steroid use.   - Continue Lantus to 15 units qhs - SSI - Glucose checks - Plan to start Metformin on discharge   # Generalized Weakness Improving. No indication or patient request for additional IV fluids today.   - Encouraged OOB - PT consulted   DVT prophx:  Xarelto Diet: Carb modified Bowel: Miralax Code: Full  Prior to Admission Living Arrangement: Home Anticipated Discharge Location: Home Barriers to Discharge: Ongoing hypoxia Dispo: Anticipated discharge in approximately 1-2 day(s).   Dr. Verdene Lennert Internal Medicine PGY-2  Pager: [phone] After 5pm on weekdays and 1pm on weekends: On Call pager [phone]  02/09/2020, 6:45 AM

## 2020-02-09 NOTE — Progress Notes (Signed)
Get patient out of the bed to chair patient became shortness of breath oxygen saturation drop to 74%/6L and pre syncope MD notified no new order given .will continue to monitor the patient

## 2020-02-10 LAB — GLUCOSE, CAPILLARY
Glucose-Capillary: 202 mg/dL — ABNORMAL HIGH (ref 70–99)
Glucose-Capillary: 176 mg/dL — ABNORMAL HIGH (ref 70–99)
Glucose-Capillary: 345 mg/dL — ABNORMAL HIGH (ref 70–99)

## 2020-02-10 LAB — FERRITIN: Ferritin: 1234 ng/mL — ABNORMAL HIGH (ref 24–336)

## 2020-02-10 LAB — D-DIMER, QUANTITATIVE: D-Dimer, Quant: 0.46 ug/mL-FEU (ref 0.00–0.50)

## 2020-02-10 LAB — LACTATE DEHYDROGENASE: LDH: 216 U/L — ABNORMAL HIGH (ref 98–192)

## 2020-02-10 LAB — C-REACTIVE PROTEIN: CRP: 1.1 mg/dL — ABNORMAL HIGH (ref <1.0)

## 2020-02-10 MED ORDER — ADULT MULTIVITAMIN W/MINERALS CH
1.0000 | ORAL_TABLET | Freq: Every day | ORAL | Status: DC
  Administered 2020-02-10 – 2020-02-15 (×6): 1 via ORAL
  Filled 2020-02-10 (×6): qty 1

## 2020-02-10 MED ORDER — INSULIN GLARGINE 100 UNIT/ML ~~LOC~~ SOLN
18.0000 [IU] | Freq: Every day | SUBCUTANEOUS | Status: DC
  Administered 2020-02-10 – 2020-02-14 (×5): 18 [IU] via SUBCUTANEOUS
  Filled 2020-02-10 (×6): qty 0.18

## 2020-02-10 NOTE — Plan of Care (Signed)
  Problem: Education: Goal: Knowledge of General Education information will improve Description Including pain rating scale, medication(s)/side effects and non-pharmacologic comfort measures Outcome: Progressing   Problem: Health Behavior/Discharge Planning: Goal: Ability to manage health-related needs will improve Outcome: Progressing   

## 2020-02-10 NOTE — Evaluation (Signed)
Physical Therapy Evaluation Patient Details Name: Raymond Lewis MRN: [id number] DOB: 1960-08-23 Today's Date: 02/10/2020   History of Present Illness  Raymond Lewis is a 60 yo M w/ no significant PMH presenting to [facility] with acute hypoxic respiratory failure due to COVID pneumonia  Clinical Impression  Pt seen sitting in recliner. Pt on HFNC 13 L with O2 varying between 88 with activity and 94% at rest. Pt states he was I prior to admission without use of AD. Pt's wife is currently in hospital with Covid, unsure if there will be assist upon discharge if needed. Following increased education on importance of therapy pt agreeable to ambulate to the window and back. Pt with 4 LOB during 35 ft of ambulation without AD requiring reaching out for objects and min A to regain balance. Pt states he can tell his balance is off. Pt demonstrating deficits in balance, gait and activity tolerance and will benefit from skilled PT to address deficits to maximize independence with functional mobility prior to discharge     Follow Up Recommendations No PT follow up    Equipment Recommendations  Rolling walker with 5" wheels    Recommendations for Other Services       Precautions / Restrictions Precautions Precautions: Fall Precaution Comments: Covid + Restrictions Weight Bearing Restrictions: No      Mobility  Bed Mobility                    Transfers Overall transfer level: Needs assistance   Transfers: Sit to/from Stand Sit to Stand: Supervision            Ambulation/Gait Ambulation/Gait assistance: Min guard;Min assist Gait Distance (Feet): 35 Feet Assistive device: None   Gait velocity: decreased   General Gait Details: 4 LOB with stepping reactions needed and min A to assist regain balance, pt reaching out for bed and wall to also assist wtih regaining balance  Stairs            Wheelchair Mobility    Modified Rankin (Stroke Patients Only)       Balance Overall  balance assessment: Needs assistance Sitting-balance support: No upper extremity supported;Feet unsupported Sitting balance-Leahy Scale: Good         Standing balance comment: balance deficits noted during ambulatio wtih 4 LOB during 35 ft of ambulation wtihout AD                             Pertinent Vitals/Pain Pain Assessment: No/denies pain    Home Living Family/patient expects to be discharged to:: Private residence Living Arrangements: Spouse/significant other   Type of Home: House Home Access: Stairs to enter   Secretary/administrator of Steps: 1 Home Layout: Two level Home Equipment: None      Prior Function Level of Independence: Independent               Hand Dominance   Dominant Hand: Right    Extremity/Trunk Assessment        Lower Extremity Assessment Lower Extremity Assessment: Overall WFL for tasks assessed       Communication   Communication: No difficulties  Cognition Arousal/Alertness: Awake/alert Behavior During Therapy: WFL for tasks assessed/performed Overall Cognitive Status: Within Functional Limits for tasks assessed  General Comments  pt requesting vitamin D and C to help with Covid, states it is what is recommended by CDC, RN notified of request    Exercises     Assessment/Plan    PT Assessment Patient needs continued PT services  PT Problem List Decreased mobility;Decreased activity tolerance;Decreased balance       PT Treatment Interventions Therapeutic exercise;Gait training;Balance training;Stair training;Patient/family education;Therapeutic activities    PT Goals (Current goals can be found in the Care Plan section)  Acute Rehab PT Goals Patient Stated Goal: I want vitamin D to kill Covid and vitamin C to get rid of this mucous PT Goal Formulation: With patient Time For Goal Achievement: 02/24/20 Potential to Achieve Goals: Good    Frequency  Min 3X/week   Barriers to discharge        Co-evaluation               AM-PAC PT "6 Clicks" Mobility  Outcome Measure Help needed turning from your back to your side while in a flat bed without using bedrails?: None Help needed moving from lying on your back to sitting on the side of a flat bed without using bedrails?: None Help needed moving to and from a bed to a chair (including a wheelchair)?: A Little Help needed standing up from a chair using your arms (e.g., wheelchair or bedside chair)?: A Little Help needed to walk in hospital room?: A Little Help needed climbing 3-5 steps with a railing? : A Little 6 Click Score: 20    End of Session Equipment Utilized During Treatment: Gait belt;Oxygen Activity Tolerance: Patient tolerated treatment well Patient left: in chair;with call bell/phone within reach Nurse Communication: Mobility status PT Visit Diagnosis: Unsteadiness on feet (R26.81);Other abnormalities of gait and mobility (R26.89)    Time: 2725-3664 PT Time Calculation (min) (ACUTE ONLY): 19 min   Charges:   PT Evaluation $PT Eval Low Complexity: 1 Low          Ginette Otto, DPT Acute Rehabilitation Services [phone]  Lucretia Field 02/10/2020, 2:30 PM

## 2020-02-10 NOTE — Progress Notes (Signed)
.     Subjective:   Raymond Lewis reports feeling about the same this morning.  He states that he has been using the incentive spirometer.  Denies any new symptoms or complaints.  Objective:  Vital signs in last 24 hours: Vitals:   02/09/20 1500 02/09/20 2022 02/10/20 0042 02/10/20 0505  BP: 111/69 (!) 141/93 112/73 117/78  Pulse: 84 73 63 78  Resp: (!) 22 19 18  (!) 25  Temp: 98 F (36.7 C) 98.7 F (37.1 C) 98.6 F (37 C) 98.2 F (36.8 C)  TempSrc: Oral Oral Oral Oral  SpO2: 97% 92% 99% 98%  Weight:    87.5 kg   Supplemental oxygen: 86-89% on 9 L, increased to 13 L during evaluation and improved to saturations greater than 90%  Gen: Well-developed, sitting in the chair, ill-appearing, NAD Card: RRR, no murmurs  Pulm: Bibasilar crackles, no wheezes. No respiratory distress or increased WOB. No accessory muscle use.  Skin: Dry, Warm, normal turgor  Neuro: AAOx3, Answers questions appropriately Psych: Normal mood and affect   Assessment/Plan:  Principal Problem:   Pneumonia due to COVID-19 virus Active Problems:   Diabetes (HCC)   AKI (acute kidney injury) (HCC)   Hypoxia  Raymond Lewis is a 60 yo M w/ no significant PMH presenting to [facility] with acute hypoxic respiratory failure due to COVID pneumonia.   Acute hypoxic respiratory failure due to COVID-19 Pneumonia Patient continues to have significant hypoxia that is quite exacerbated by motion.  He was saturating 86 to 89% on 9 L during evaluation.  This was increased to 13 L with improvement saturations greater than 90%. Continue to encourage pulmonary rehab - Continue Dexamethasone (day 7/10) - O2 as need to keep >92% - Early ambulation, proning as tolerated, incentive spirometry, flutter valve   Diabetes Mellitus, Type 2 CBGs exacerbated by steroid use.  - Continue Lantus to 15 units qhs - SSI - CBG monitoring - Plan to start Metformin on discharge    DVT prophx: Xarelto Diet: Carb modified Bowel: Miralax Code:  Full  Prior to Admission Living Arrangement: Home Anticipated Discharge Location: Home Barriers to Discharge: Ongoing hypoxia Dispo: Anticipated discharge in approximately 1-2 day(s).   [facility], DO Internal Medicine PGY-2  Pager: [phone] After 5pm on weekdays and 1pm on weekends: On Call pager [phone]  02/10/2020, 11:36 AM

## 2020-02-11 ENCOUNTER — Inpatient Hospital Stay (HOSPITAL_COMMUNITY): Payer: HRSA Program

## 2020-02-11 LAB — GLUCOSE, CAPILLARY
Glucose-Capillary: 124 mg/dL — ABNORMAL HIGH (ref 70–99)
Glucose-Capillary: 240 mg/dL — ABNORMAL HIGH (ref 70–99)
Glucose-Capillary: 299 mg/dL — ABNORMAL HIGH (ref 70–99)

## 2020-02-11 LAB — PROCALCITONIN: Procalcitonin: <0.1 ng/mL

## 2020-02-11 MED ORDER — INSULIN ASPART 100 UNIT/ML ~~LOC~~ SOLN
0.0000 [IU] | Freq: Three times a day (TID) | SUBCUTANEOUS | Status: DC
  Administered 2020-02-11: 11 [IU] via SUBCUTANEOUS
  Administered 2020-02-11: 7 [IU] via SUBCUTANEOUS
  Administered 2020-02-12: 11 [IU] via SUBCUTANEOUS
  Administered 2020-02-12: 7 [IU] via SUBCUTANEOUS
  Administered 2020-02-12: 20 [IU] via SUBCUTANEOUS
  Administered 2020-02-13: 15 [IU] via SUBCUTANEOUS
  Administered 2020-02-13: 7 [IU] via SUBCUTANEOUS
  Administered 2020-02-14: 4 [IU] via SUBCUTANEOUS
  Administered 2020-02-14 (×2): 11 [IU] via SUBCUTANEOUS

## 2020-02-11 NOTE — Progress Notes (Addendum)
.   Subjective:   Overnight, no acute events.  This morning, states he is continuing to have trouble breathing. Mostly having congestion. He feels constipated, states he has felt this since he's been here. His last bowel movement was yesterday. He has no further questions or concerns.   Objective:  Vital signs in last 24 hours: Vitals:   02/10/20 1226 02/10/20 1426 02/11/20 0036 02/11/20 0640  BP: 112/76  128/69 (!) 104/58  Pulse: 78  83   Resp: (!) 21  19 20   Temp: 98.2 F (36.8 C)  98.5 F (36.9 C) 99.4 F (37.4 C)  TempSrc: Oral   Oral  SpO2: 93% (!) 88% 95% 100%  Weight:      SpO2: 100 % O2 Flow Rate (L/min): 14 L/min  Intake/Output Summary (Last 24 hours) at 02/11/2020 1017 Last data filed at 02/11/2020 0000 Gross per 24 hour  Intake 240 ml  Output 700 ml  Net -460 ml   Filed Weights   02/08/20 0100 02/09/20 0324 02/10/20 0505  Weight: 87.2 kg 87.9 kg 87.5 kg   Physical Exam Cardiovascular:     Rate and Rhythm: Normal rate and regular rhythm.     Pulses: Normal pulses.     Heart sounds: Normal heart sounds.  Pulmonary:     Effort: Pulmonary effort is normal. No respiratory distress.  Abdominal:     General: Abdomen is flat. Bowel sounds are normal.     Palpations: Abdomen is soft.     Tenderness: There is no abdominal tenderness.  Musculoskeletal:        General: Normal range of motion.  Skin:    General: Skin is warm and dry.     Capillary Refill: Capillary refill takes less than 2 seconds.  Neurological:     General: No focal deficit present.     Mental Status: Mental status is at baseline.  Psychiatric:        Mood and Affect: Mood normal.        Behavior: Behavior normal.        Thought Content: Thought content normal.        Judgment: Judgment normal.    LABS: LDH - 216 from 273 Ferritin - 1,234 from 1,266 D-dimer - 0.46 from <0.27 CRP - 1.1 from 2.0 Glucose 24h - 176-345  IMAGING: No results found.  Assessment/Plan:  Principal  Problem:   Pneumonia due to COVID-19 virus Active Problems:   Diabetes (HCC)   AKI (acute kidney injury) (HCC)   Hypoxia  Raymond Lewis is a 60 year old male with no significant past medical history who was admitted 02/04/20 for acute hypoxic respiratory failure 2/2 COVID pneumonia (on dexamethasone) with progressively worsening of oxygen requirement (most recently 13L HFNC)  #Acute hypoxic respiratory failure due to COVID-19 pneumonia Patient saturating well at rest on 13L oxygen via HFNC. He has been treated with dexamethasone since admission. Patient's repeat inflammatory markers relatively stable from values upon admission. Low suspicion for PE at this time. Patient would benefit from repeat radiographic evaluation of the chest to assess for superimposed bacterial pneumonia. -Continue Dexamethasone (day 8/10) -CXR today -Procalcitonin today -Continue supplemental oxygen to maintain SpO2 >92% -Early ambulation, proning as tolerated, incentive spirometry, flutter valve -Daily CBC, BMP   #Type 2 diabetes mellitus, chronic CBGs exacerbated by steroid use.  -Continue Lantus 18 units nightly -Increase SSI from moderate to resistant -CBG monitoring -Plan to start Metformin on discharge   DVT prophx: Xarelto Diet: Carb modified Bowel: Miralax Code: Full  Prior to Admission Living Arrangement: Home Anticipated Discharge Location: Home Barriers to Discharge: Ongoing hypoxia Dispo: Anticipated discharge in approximately 1-2 day(s).   Jasmine December, MD Internal Medicine PGY-1 Pager: [phone] After 5pm on weekdays and 1pm on weekends: On Call pager [phone]  02/11/2020, 10:17 AM

## 2020-02-12 ENCOUNTER — Inpatient Hospital Stay (HOSPITAL_COMMUNITY): Payer: HRSA Program

## 2020-02-12 DIAGNOSIS — R0603 Acute respiratory distress: Secondary | ICD-10-CM

## 2020-02-12 LAB — CBC
HCT: 40.9 % (ref 39.0–52.0)
Hemoglobin: 14.6 g/dL (ref 13.0–17.0)
MCH: 31.4 pg (ref 26.0–34.0)
MCHC: 35.7 g/dL (ref 30.0–36.0)
MCV: 88 fL (ref 80.0–100.0)
Platelets: 238 10*3/uL (ref 150–400)
RBC: 4.65 MIL/uL (ref 4.22–5.81)
RDW: 11.8 % (ref 11.5–15.5)
WBC: 8.1 10*3/uL (ref 4.0–10.5)
nRBC: 0 % (ref 0.0–0.2)

## 2020-02-12 LAB — ECHOCARDIOGRAM LIMITED
Area-P 1/2: 3.21 cm2
Calc EF: 61.1 %
S' Lateral: 3.4 cm
Single Plane A2C EF: 61.2 %
Single Plane A4C EF: 59.5 %
Weight: 3097.02 oz

## 2020-02-12 LAB — GLUCOSE, CAPILLARY
Glucose-Capillary: 251 mg/dL — ABNORMAL HIGH (ref 70–99)
Glucose-Capillary: 231 mg/dL — ABNORMAL HIGH (ref 70–99)
Glucose-Capillary: 341 mg/dL — ABNORMAL HIGH (ref 70–99)

## 2020-02-12 LAB — BASIC METABOLIC PANEL
Anion gap: 10 (ref 5–15)
BUN: 15 mg/dL (ref 6–20)
CO2: 27 mmol/L (ref 22–32)
Calcium: 9 mg/dL (ref 8.9–10.3)
Chloride: 98 mmol/L (ref 98–111)
Creatinine, Ser: 0.86 mg/dL (ref 0.61–1.24)
GFR, Estimated: >60 mL/min (ref >60)
Glucose, Bld: 119 mg/dL — ABNORMAL HIGH (ref 70–99)
Potassium: 3.9 mmol/L (ref 3.5–5.1)
Sodium: 135 mmol/L (ref 135–145)

## 2020-02-12 MED ORDER — SENNOSIDES-DOCUSATE SODIUM 8.6-50 MG PO TABS
2.0000 | ORAL_TABLET | Freq: Two times a day (BID) | ORAL | Status: DC
  Administered 2020-02-12 – 2020-02-15 (×7): 2 via ORAL
  Filled 2020-02-12 (×7): qty 2

## 2020-02-12 MED ORDER — POLYETHYLENE GLYCOL 3350 17 G PO PACK
17.0000 g | PACK | Freq: Two times a day (BID) | ORAL | Status: DC
  Administered 2020-02-12 – 2020-02-15 (×7): 17 g via ORAL
  Filled 2020-02-12 (×7): qty 1

## 2020-02-12 NOTE — Progress Notes (Signed)
Physical Therapy Treatment Patient Details Name: Raymond Lewis MRN: [id number] DOB: 07-11-1960 Today's Date: 02/12/2020    History of Present Illness Raymond Lewis is a 60 yo M w/ no significant PMH presenting to [facility] with acute hypoxic respiratory failure due to COVID pneumonia    PT Comments    Pt progressing slowly towards his physical therapy goals. Requiring supervision for functional mobility; ambulating 15 ft with no assistive device. Increased time required due to pt multiple desaturations to low 80's edge of bed on 13L O2 via HFNC; where pt would frequently lie back down. Increased to 15L O2, in addition to removed oxygen extensions in order to directly attach to canister. Pt subsequently able to rebound to 95-97%. Education provided regarding IS use (pt pulling 750 ml), exercise recommendations, endurance conservation techniques, proning, sitting up in the chair, and breathing techniques. Will continue to follow acutely.     Follow Up Recommendations  No PT follow up     Equipment Recommendations  Rolling walker with 5" wheels    Recommendations for Other Services       Precautions / Restrictions Precautions Precautions: Other (comment) Precaution Comments: HFNC Restrictions Weight Bearing Restrictions: No    Mobility  Bed Mobility Overal bed mobility: Modified Independent                Transfers Overall transfer level: Needs assistance Equipment used: None Transfers: Sit to/from Stand Sit to Stand: Supervision         General transfer comment: supervision for safety  Ambulation/Gait Ambulation/Gait assistance: Supervision Gait Distance (Feet): 15 Feet Assistive device: None Gait Pattern/deviations: Step-through pattern;Decreased stride length Gait velocity: decreased   General Gait Details: Supervision for safety, line management   Stairs             Wheelchair Mobility    Modified Rankin (Stroke Patients Only)       Balance Overall  balance assessment: Mild deficits observed, not formally tested                                          Cognition Arousal/Alertness: Awake/alert Behavior During Therapy: WFL for tasks assessed/performed Overall Cognitive Status: Impaired/Different from baseline Area of Impairment: Problem solving                             Problem Solving: Slow processing General Comments: Very slow processing to complete motor tasks, needs repetition      Exercises General Exercises - Lower Extremity Ankle Circles/Pumps: Both;10 reps;Seated    General Comments        Pertinent Vitals/Pain Pain Assessment: Faces Faces Pain Scale: Hurts a little bit Pain Location: right upper back with inhalation Pain Descriptors / Indicators: Aching Pain Intervention(s): Monitored during session    Home Living                      Prior Function            PT Goals (current goals can now be found in the care plan section) Acute Rehab PT Goals Patient Stated Goal: clear mucus Potential to Achieve Goals: Good Progress towards PT goals: Progressing toward goals    Frequency    Min 3X/week      PT Plan Current plan remains appropriate    Co-evaluation  AM-PAC PT "6 Clicks" Mobility   Outcome Measure  Help needed turning from your back to your side while in a flat bed without using bedrails?: None Help needed moving from lying on your back to sitting on the side of a flat bed without using bedrails?: None Help needed moving to and from a bed to a chair (including a wheelchair)?: None Help needed standing up from a chair using your arms (e.g., wheelchair or bedside chair)?: None Help needed to walk in hospital room?: None Help needed climbing 3-5 steps with a railing? : A Little 6 Click Score: 23    End of Session Equipment Utilized During Treatment: Oxygen Activity Tolerance: Patient tolerated treatment well Patient left: in  chair;with call bell/phone within reach Nurse Communication: Mobility status PT Visit Diagnosis: Unsteadiness on feet (R26.81);Other abnormalities of gait and mobility (R26.89)     Time: 0865-7846 PT Time Calculation (min) (ACUTE ONLY): 53 min  Charges:  $Therapeutic Activity: 23-37 mins $Self Care/Home Management: 8-22                     Lillia Pauls, PT, DPT Acute Rehabilitation Services Pager [phone] Office [phone]    Raymond Lewis 02/12/2020, 1:04 PM

## 2020-02-12 NOTE — Progress Notes (Signed)
Subjective:  Overnight, no acute events.  This morning, reports feeling well this morning. States his breathing has improved. Reports he has been constipated since he has been in the hospital and this is his primary concern. Demonstrated incentive spirometry use on exam today.   Objective:  Vital signs in last 24 hours: Vitals:   02/12/20 0449 02/12/20 0451 02/12/20 0500 02/12/20 0743  BP:  107/84    Pulse:   91   Resp:  (!) 24    Temp:   98.3 F (36.8 C) 99 F (37.2 C)  TempSrc:   Oral Oral  SpO2:   100%   Weight: 87.8 kg     SpO2: 100 % O2 Flow Rate (L/min): 14 L/min  Intake/Output Summary (Last 24 hours) at 02/12/2020 1111 Last data filed at 02/12/2020 1000 Gross per 24 hour  Intake 120 ml  Output 1450 ml  Net -1330 ml   Filed Weights   02/09/20 0324 02/10/20 0505 02/12/20 0449  Weight: 87.9 kg 87.5 kg 87.8 kg   Physical Exam Constitutional:      General: He is not in acute distress. Neck:     Comments: No JVD Cardiovascular:     Rate and Rhythm: Normal rate and regular rhythm.     Pulses: Normal pulses.     Heart sounds: Normal heart sounds.  Pulmonary:     Effort: Pulmonary effort is normal. No respiratory distress.  Abdominal:     General: Abdomen is flat. Bowel sounds are normal.     Palpations: Abdomen is soft.     Tenderness: There is no abdominal tenderness.  Musculoskeletal:        General: Normal range of motion.     Right lower leg: No edema.     Left lower leg: No edema.  Skin:    General: Skin is warm and dry.     Capillary Refill: Capillary refill takes less than 2 seconds.  Neurological:     General: No focal deficit present.     Mental Status: Mental status is at baseline.  Psychiatric:        Mood and Affect: Mood normal.        Behavior: Behavior normal.        Thought Content: Thought content normal.        Judgment: Judgment normal.    LABS: CBC Latest Ref Rng & Units 02/12/2020 02/08/2020 02/07/2020  WBC 4.0 - 10.5 K/uL 8.1 4.5  4.2  Hemoglobin 13.0 - 17.0 g/dL 80.0 34.9 17.1(H)  Hematocrit 39.0 - 52.0 % 40.9 45.5 46.7  Platelets 150 - 400 K/uL 238 199 154   BMP Latest Ref Rng & Units 02/12/2020 02/09/2020 02/08/2020  Glucose 70 - 99 mg/dL 179(X) 505(W) 979(Y)  BUN 6 - 20 mg/dL 15 15 80(X)  Creatinine 0.61 - 1.24 mg/dL 6.55 3.74 8.27  Sodium 135 - 145 mmol/L 135 135 137  Potassium 3.5 - 5.1 mmol/L 3.9 4.2 4.2  Chloride 98 - 111 mmol/L 98 98 100  CO2 22 - 32 mmol/L 27 25 24   Calcium 8.9 - 10.3 mg/dL 9.0 ) 9.1   Procalcitonin - <0.10 Glucose 24h - 124-299  IMAGING: DG Chest 1 View IMPRESSION: Increase in bibasilar airspace opacity consistent with multifocal pneumonia. Suspect atypical organism pneumonia, particularly given the clinical history. Heart size normal.  No evident adenopathy. Electronically Signed   By: 0.7(E III M.D.   On: 02/11/2020 10:31   Echocardiogram - in process  Assessment/Plan:  Principal  Problem:   Pneumonia due to COVID-19 virus Active Problems:   Diabetes (HCC)   AKI (acute kidney injury) (HCC)   Hypoxia  Raymond Lewis is a 60 year old male with no significant past medical history who was admitted 02/04/20 for acute hypoxic respiratory failure 2/2 COVID pneumonia (on dexamethasone) with progressively worsening of oxygen requirement (most recently 13L HFNC)  #Acute hypoxic respiratory failure due to COVID-19 pneumonia Patient saturating at 100% on 14L oxygen via HFNC. He has been treated with dexamethasone since admission. Patient's repeat inflammatory markers relatively stable from values upon admission. Low suspicion for PE at this time and superimposed bacterial pneumonia. Patient does have worsening patchy, multifocal infiltrates concerning for the development of ARDS. Echocardiogram in process to evaluate for cardiogenic edema, however patient does not appear hypervolemic on physical examination. -Continue Dexamethasone (day 9/10) -Follow-up echocardiogram -Continue  supplemental oxygen to maintain SpO2 >92% -Early ambulation, proning as tolerated, incentive spirometry, flutter valve -Daily CBC, BMP   #Type 2 diabetes mellitus, chronic Hemoglobin of 7.8 on admission. No prior knowledge of diabetes. Current blood glucose readings exacerbated by dexamethasone use. -Continue Lantus 18 units nightly -Continue resistant SSI -CBG monitoring -Plan to start Metformin on discharge   DVT prophx: Xarelto Diet: Carb modified Bowel: Miralax Code: Full  Prior to Admission Living Arrangement: Home Anticipated Discharge Location: Home Barriers to Discharge: Ongoing hypoxia Dispo: Anticipated discharge in approximately 1-2 day(s).   Jasmine December, MD Internal Medicine PGY-1 Pager: [phone] After 5pm on weekdays and 1pm on weekends: On Call pager [phone]  02/12/2020, 11:11 AM

## 2020-02-12 NOTE — Progress Notes (Signed)
  Echocardiogram 2D Echocardiogram has been performed.  Raymond Lewis 02/12/2020, 11:37 AM

## 2020-02-12 NOTE — Progress Notes (Signed)
Pt is refusing to wear hiflow nasal cannula. P requested that he be placd back on regular nasal cannula so that he can go to the bathroom when he is ready. Pt was not ammendable to bsc. Pt o2 on 6lnc saltzer at rest is 95%

## 2020-02-13 LAB — BASIC METABOLIC PANEL
Anion gap: 12 (ref 5–15)
BUN: 18 mg/dL (ref 6–20)
CO2: 23 mmol/L (ref 22–32)
Calcium: 9.6 mg/dL (ref 8.9–10.3)
Chloride: 99 mmol/L (ref 98–111)
Creatinine, Ser: 0.68 mg/dL (ref 0.61–1.24)
GFR, Estimated: >60 mL/min (ref >60)
Glucose, Bld: 197 mg/dL — ABNORMAL HIGH (ref 70–99)
Potassium: 4.4 mmol/L (ref 3.5–5.1)
Sodium: 134 mmol/L — ABNORMAL LOW (ref 135–145)

## 2020-02-13 LAB — CBC
HCT: 42.1 % (ref 39.0–52.0)
Hemoglobin: 14.4 g/dL (ref 13.0–17.0)
MCH: 30.4 pg (ref 26.0–34.0)
MCHC: 34.2 g/dL (ref 30.0–36.0)
MCV: 88.8 fL (ref 80.0–100.0)
Platelets: 261 10*3/uL (ref 150–400)
RBC: 4.74 MIL/uL (ref 4.22–5.81)
RDW: 11.7 % (ref 11.5–15.5)
WBC: 10.5 10*3/uL (ref 4.0–10.5)
nRBC: 0 % (ref 0.0–0.2)

## 2020-02-13 LAB — GLUCOSE, CAPILLARY
Glucose-Capillary: 228 mg/dL — ABNORMAL HIGH (ref 70–99)
Glucose-Capillary: 313 mg/dL — ABNORMAL HIGH (ref 70–99)
Glucose-Capillary: 308 mg/dL — ABNORMAL HIGH (ref 70–99)

## 2020-02-13 NOTE — Plan of Care (Signed)

## 2020-02-13 NOTE — Progress Notes (Signed)
Subjective:  Overnight, patient requested to not wear high flow nasal cannula and be placed on regular nasal cannula.  This morning, patient seen sitting up in the chair. States that he feels well and denies shortness of breath. He endorses a cough but states that it responds well to Robitussin. He reports a regular bowel movement yesterday afternoon on new bowel regimen. He has no complaints or concerns.  Objective:  Vital signs in last 24 hours: Vitals:   02/13/20 0800 02/13/20 0900 02/13/20 1000 02/13/20 1100  BP:      Pulse: 86 89 80 94  Resp:      Temp:      TempSrc:      SpO2: 95% 94% 97% 95%  Weight:      SpO2: 95 % O2 Flow Rate (L/min): 6 L/min  Intake/Output Summary (Last 24 hours) at 02/13/2020 1151 Last data filed at 02/12/2020 2055 Gross per 24 hour  Intake 720 ml  Output 850 ml  Net -130 ml   Filed Weights   02/09/20 0324 02/10/20 0505 02/12/20 0449  Weight: 87.9 kg 87.5 kg 87.8 kg   Physical Exam Constitutional:      General: He is not in acute distress. Neck:     Comments: No JVD Cardiovascular:     Rate and Rhythm: Normal rate and regular rhythm.     Pulses: Normal pulses.     Heart sounds: Normal heart sounds.  Pulmonary:     Effort: Pulmonary effort is normal. No respiratory distress.  Abdominal:     General: Abdomen is flat. Bowel sounds are normal.     Palpations: Abdomen is soft.     Tenderness: There is no abdominal tenderness.  Musculoskeletal:        General: Normal range of motion.     Right lower leg: No edema.     Left lower leg: No edema.  Skin:    General: Skin is warm and dry.     Capillary Refill: Capillary refill takes less than 2 seconds.  Neurological:     General: No focal deficit present.     Mental Status: Mental status is at baseline.  Psychiatric:        Mood and Affect: Mood normal.        Behavior: Behavior normal.        Thought Content: Thought content normal.        Judgment: Judgment normal.    LABS: CBC  Latest Ref Rng & Units 02/13/2020 02/12/2020 02/08/2020  WBC 4.0 - 10.5 K/uL 10.5 8.1 4.5  Hemoglobin 13.0 - 17.0 g/dL 60.1 09.3 23.5  Hematocrit 39.0 - 52.0 % 42.1 40.9 45.5  Platelets 150 - 400 K/uL 261 238 199   BMP Latest Ref Rng & Units 02/13/2020 02/12/2020 02/09/2020  Glucose 70 - 99 mg/dL 573(U) 202(R) 427(C)  BUN 6 - 20 mg/dL 18 15 15   Creatinine 0.61 - 1.24 mg/dL 6.23 7.62  Sodium 135 - 145 mmol/L 134(L) 135 135  Potassium 3.5 - 5.1 mmol/L 4.4 3.9 4.2  Chloride 98 - 111 mmol/L 99 98 98  CO2 22 - 32 mmol/L 23 27 25   Calcium 8.9 - 10.3 mg/dL 9.6 9.0 8.31)   Glucose 24h - 231-341  IMAGING: No results found. Assessment/Plan:  Principal Problem:   Pneumonia due to COVID-19 virus Active Problems:   Diabetes (HCC)   AKI (acute kidney injury) (HCC)   Hypoxia  Raymond Lewis is a 60 year old male with no significant past medical  history who was admitted 02/04/20 for acute hypoxic respiratory failure 2/2 COVID pneumonia (on dexamethasone) with progressively worsening of oxygen requirement (most recently 13L HFNC)  #Acute hypoxic respiratory failure due to COVID-19 pneumonia, active Patient desiring to wear nasal cannula rather than HFNC. He is most recently on 6L oxygen saturating in the low 85-95% at rest. Today is his final dose of dexamethasone. At this point, patient continues to require hospitalization due to his hypoxia. -Complete final dose of dexamethasone today -Continue supplemental oxygen to maintain SpO2 >92% -Early ambulation, proning as tolerated, incentive spirometry, flutter valve -Robitussin Q4H PRN, Tussionex Q12H PRN -Daily BMP   #Type 2 diabetes mellitus, chronic Hemoglobin of 7.8 on admission. No prior knowledge of diabetes. Blood glucose readings elevated, however patient receiving final dose of dexamethasone today and may have improvement in his readings following discontinuation of this medication. -Continue Lantus 18 units nightly -Continue resistant  SSI -Adjust insulin regimen following cessation of dexamethasone -CBG monitoring -Plan to start Metformin on discharge   DVT prophx: Xarelto Diet: Carb modified Bowel: Miralax 17g twice daily, Senna-docusate 2 tablet twice daily Code: Full  Prior to Admission Living Arrangement: Home Anticipated Discharge Location: Home Barriers to Discharge: Ongoing hypoxia Dispo: Anticipated discharge in approximately 1-2 day(s).   Jasmine December, MD Internal Medicine PGY-1 Pager: [phone] After 5pm on weekdays and 1pm on weekends: On Call pager [phone]  02/13/2020, 11:51 AM

## 2020-02-13 NOTE — Progress Notes (Signed)
Pt refusing to ambulate in room.

## 2020-02-13 NOTE — Progress Notes (Signed)
Inpatient Diabetes Program Recommendations  AACE/ADA: New Consensus Statement on Inpatient Glycemic Control (2015)  Target Ranges:  Prepandial:   less than 140 mg/dL      Peak postprandial:   less than 180 mg/dL (1-2 hours)      Critically ill patients:  140 - 180 mg/dL   Lab Results  Component Value Date   GLUCAP 228 (H) 02/13/2020   HGBA1C 7.8 (H) 02/05/2020    Review of Glycemic Control Results for Raymond Lewis, Raymond Lewis (MRN [id number]) as of 02/13/2020 14:01  Ref. Range 02/11/2020 17:09 02/12/2020 07:44 02/12/2020 12:21 02/12/2020 17:11 02/13/2020 12:00  Glucose-Capillary Latest Ref Range: 70 - 99 mg/dL 016 (H) 010 (H) 932 (H) 341 (H) 228 (H)   Diabetes history: New diagnosis of DM-A1C=7.8% Current orders for Inpatient glycemic control:  Lantus 18 units q HS Novolog resistant tid with meals  Inpatient Diabetes Program Recommendations:    Called and spoke with patient regarding new diagnosis of DM.  He states that dr. Effie Berkshire that steroids were influencing high blood sugars.  Last dose of Decadron was today.  Spoke with pt about new diagnosis.  Discussed A1C results with him and explained what an A1C is, basic pathophysiology of DM Type 2, basic home care, importance of checking CBGs and maintaining good CBG control to prevent long-term and short-term complications.   Patient states that he does not need DM booklet b/c he is sure that blood sugars will improve at home.  Encouraged him to take it with him and that he could share with other family members if he does not need it. He has a meter but it is old.  Note - No insurance.  Will provide Rel[employer] meter for patient to monitor at home.  Discussed that normal blood sugars are 80-130 mg/dL fasting and less than 355 mg/dL post-prandial.  He does not drink sweetened beverages and states he know that "moderation" is the key to controlling blood sugars.  Needs PCP for f/u of A1C- Explained this to pt and he verbalized understanding.   Thanks  Beryl Meager, RN, BC-ADM Inpatient Diabetes Coordinator Pager [phone] (8a-5p)

## 2020-02-13 NOTE — Progress Notes (Signed)
Pt's home CBG equipment placed with pt's belongings in the closet. Tele called twice about pt being off the monitor. This RN checked on pt, he was in the bathroom, sitting on the toilet, scrolling on his phone. RN: You ok? Just checking on you. They called me about your leads, just don't forget to plug them back in when you're done. Pt, in a rude and condescending tone: don't worry about it. Tele notified

## 2020-02-14 LAB — GLUCOSE, CAPILLARY
Glucose-Capillary: 152 mg/dL — ABNORMAL HIGH (ref 70–99)
Glucose-Capillary: 277 mg/dL — ABNORMAL HIGH (ref 70–99)
Glucose-Capillary: 259 mg/dL — ABNORMAL HIGH (ref 70–99)
Glucose-Capillary: 136 mg/dL — ABNORMAL HIGH (ref 70–99)

## 2020-02-14 LAB — BASIC METABOLIC PANEL
Anion gap: 10 (ref 5–15)
BUN: 15 mg/dL (ref 6–20)
CO2: 29 mmol/L (ref 22–32)
Calcium: 9.5 mg/dL (ref 8.9–10.3)
Chloride: 97 mmol/L — ABNORMAL LOW (ref 98–111)
Creatinine, Ser: 0.82 mg/dL (ref 0.61–1.24)
GFR, Estimated: >60 mL/min (ref >60)
Glucose, Bld: 224 mg/dL — ABNORMAL HIGH (ref 70–99)
Potassium: 4.5 mmol/L (ref 3.5–5.1)
Sodium: 136 mmol/L (ref 135–145)

## 2020-02-14 MED ORDER — INSULIN ASPART 100 UNIT/ML ~~LOC~~ SOLN
5.0000 [IU] | Freq: Three times a day (TID) | SUBCUTANEOUS | Status: DC
  Administered 2020-02-14: 5 [IU] via SUBCUTANEOUS

## 2020-02-14 NOTE — Progress Notes (Addendum)
Subjective:  Overnight, no acute events.  This morning, patient reports feeling well. States his cough has improved. He has been ambulating in the room on his own and doing well. He states he has no concerns at this time. States he feels his breathing is doing fine.   Objective:  Vital signs in last 24 hours: Vitals:   02/13/20 1100 02/13/20 1202 02/13/20 2010 02/14/20 0614  BP:  98/69 107/78 112/78  Pulse: 94 93 89 82  Resp:  20 20   Temp:   98.1 F (36.7 C) 98.5 F (36.9 C)  TempSrc:  Oral  Oral  SpO2: 95% 96% 96% 99%  Weight:      SpO2: 99 % O2 Flow Rate (L/min): 6 L/min  Intake/Output Summary (Last 24 hours) at 02/14/2020 0639 Last data filed at 02/13/2020 2100 Gross per 24 hour  Intake 600 ml  Output 200 ml  Net 400 ml   Filed Weights   02/09/20 0324 02/10/20 0505 02/12/20 0449  Weight: 87.9 kg 87.5 kg 87.8 kg   Physical Exam Constitutional:      General: He is not in acute distress. Cardiovascular:     Rate and Rhythm: Normal rate and regular rhythm.     Pulses: Normal pulses.     Heart sounds: Normal heart sounds.  Pulmonary:     Effort: Pulmonary effort is normal. No respiratory distress.  Abdominal:     General: Abdomen is flat. Bowel sounds are normal.     Palpations: Abdomen is soft.     Tenderness: There is no abdominal tenderness.  Musculoskeletal:        General: Normal range of motion.     Right lower leg: No edema.     Left lower leg: No edema.  Skin:    General: Skin is warm and dry.     Capillary Refill: Capillary refill takes less than 2 seconds.  Neurological:     General: No focal deficit present.     Mental Status: Mental status is at baseline.  Psychiatric:        Mood and Affect: Mood normal.        Behavior: Behavior normal.        Thought Content: Thought content normal.        Judgment: Judgment normal.    Labs in past 24 hours: BMP Latest Ref Rng & Units 02/14/2020 02/13/2020 02/12/2020  Glucose 70 - 99 mg/dL 784(O) 962(X)  528(U)  BUN 6 - 20 mg/dL 15 18 15   Creatinine 0.61 - 1.24 mg/dL 1.32 4.40  Sodium 135 - 145 mmol/L 136 134(L) 135  Potassium 3.5 - 5.1 mmol/L 4.5 4.4 3.9  Chloride 98 - 111 mmol/L 97(L) 99 98  CO2 22 - 32 mmol/L 29 23 27   Calcium 8.9 - 10.3 mg/dL 9.5 9.6 9.0   Glucose: 152-313  Imaging in past 24 hours: No results found.   Assessment/Plan:  Principal Problem:   Pneumonia due to COVID-19 virus Active Problems:   Diabetes (HCC)   AKI (acute kidney injury) (HCC)   Hypoxia  Raymond Lewis is a 60 year old male with no significant past medical history who was admitted 02/04/20 for acute hypoxic respiratory failure 2/2 COVID pneumonia.  #Acute hypoxic respiratory failure due to COVID-19 pneumonia, active Patient has tolerated de-escalation of his oxygen to 4-6L with oxygen saturation maintained in the high 80s-low 90s while at rest. He has completed his course of dexamethasone on 02/13/20. At this point, patient continues to require hospitalization  due to his ongoing hypoxia. -Continue supplemental oxygen to maintain SpO2 >92% -Increase ambulation -Proning as tolerated, incentive spirometry, flutter valve -Robitussin Q4H PRN, Tussionex Q12H PRN -Daily BMP   #Type 2 diabetes mellitus, chronic Hemoglobin of 7.8 on admission. No prior history of diabetes. Blood glucose readings persistently elevated. -Continue Lantus 18 units nightly -Start 5u novolog TID AC -Continue resistant SSI -CBG monitoring -Plan to start Metformin on discharge   DVT prophx: Xarelto Diet: Carb modified Bowel: Miralax 17g twice daily, Senna-docusate 2 tablet twice daily Code: Full  Prior to Admission Living Arrangement: Home Anticipated Discharge Location: Home Barriers to Discharge: Ongoing hypoxia Dispo: Anticipated discharge in approximately 1-2 day(s).   Jasmine December, MD Internal Medicine PGY-1 Pager: [phone] After 5pm on weekdays and 1pm on weekends: On Call pager [phone]  02/14/2020,  6:39 AM

## 2020-02-14 NOTE — Progress Notes (Signed)
Patient states he is voiding without difficulty but doesn't want to urinate in the hat for measurements.

## 2020-02-14 NOTE — Progress Notes (Signed)
Physical Therapy Treatment Patient Details Name: Raymond Lewis MRN: [id number] DOB: Jun 20, 1960 Today's Date: 02/14/2020    History of Present Illness Raymond Lewis is a 60 yo M w/ no significant PMH presenting to [facility] with acute hypoxic respiratory failure due to COVID pneumonia    PT Comments    Pt up in chair on arrival to room. He reports he has been up walking several times independently and required mod encouragement to participate in therapy.  Pt ambulated in room on 4L O2. With sats >95%. O2 removed for short distance ambulation and remained >88%, however once seated and resting sats dropped to 80% and supplamental O2 was reapplied. Pt with no SOS of SOB and reported no sensation of lightheadedness or dizziness. Will continue to follow acutely for mobility progression.    Follow Up Recommendations  No PT follow up     Equipment Recommendations  Rolling walker with 5" wheels    Recommendations for Other Services       Precautions / Restrictions Precautions Precautions: Other (comment) Precaution Comments: HFNC    Mobility  Bed Mobility Overal bed mobility: Modified Independent                Transfers Overall transfer level: Needs assistance Equipment used: None Transfers: Sit to/from Stand Sit to Stand: Supervision         General transfer comment: supervision for safety  Ambulation/Gait Ambulation/Gait assistance: Supervision Gait Distance (Feet): 60 Feet Assistive device: None Gait Pattern/deviations: Step-through pattern;Decreased stride length Gait velocity: decreased   General Gait Details: Supervision for safety, line management. Pt on 4L O2. With sats >95%. O2 removed for short distance ambulation and remained >88%, however once seated and resting sats dropped to 80% and supplamental O2 was reapplied. Pt with no SOS of SOB and reported no sensation of lightheadedness or dizziness.   Stairs             Wheelchair Mobility    Modified  Rankin (Stroke Patients Only)       Balance Overall balance assessment: Mild deficits observed, not formally tested Sitting-balance support: No upper extremity supported;Feet unsupported Sitting balance-Leahy Scale: Good         Standing balance comment: Possibly some staggering with gait, though hard to tell if it was intentional as pt was having attitude about getting up and making hopping movements to prove a point.                            Cognition Arousal/Alertness: Awake/alert Behavior During Therapy: WFL for tasks assessed/performed Overall Cognitive Status: Impaired/Different from baseline Area of Impairment: Problem solving                             Problem Solving: Slow processing General Comments: Very slow processing to complete motor tasks, needs repetition      Exercises      General Comments        Pertinent Vitals/Pain Pain Assessment: Faces Faces Pain Scale: No hurt    Home Living                      Prior Function            PT Goals (current goals can now be found in the care plan section) Acute Rehab PT Goals Patient Stated Goal: clear mucus PT Goal Formulation: With patient Time For Goal Achievement: 02/24/20 Potential  to Achieve Goals: Good Progress towards PT goals: Progressing toward goals    Frequency    Min 3X/week      PT Plan Current plan remains appropriate    Co-evaluation              AM-PAC PT "6 Clicks" Mobility   Outcome Measure  Help needed turning from your back to your side while in a flat bed without using bedrails?: None Help needed moving from lying on your back to sitting on the side of a flat bed without using bedrails?: None Help needed moving to and from a bed to a chair (including a wheelchair)?: None Help needed standing up from a chair using your arms (e.g., wheelchair or bedside chair)?: None Help needed to walk in hospital room?: None Help needed climbing  3-5 steps with a railing? : A Little 6 Click Score: 23    End of Session Equipment Utilized During Treatment: Oxygen Activity Tolerance: Patient tolerated treatment well Patient left: in chair;with call bell/phone within reach Nurse Communication: Mobility status PT Visit Diagnosis: Unsteadiness on feet (R26.81);Other abnormalities of gait and mobility (R26.89)     Time: 2025-4270 PT Time Calculation (min) (ACUTE ONLY): 30 min  Charges:  $Gait Training: 23-37 mins                     Raymond Lewis, [place] Pager [phone] Acute Rehab   Raymond Lewis 02/14/2020, 2:13 PM

## 2020-02-15 ENCOUNTER — Other Ambulatory Visit (HOSPITAL_COMMUNITY): Payer: Self-pay | Admitting: Student

## 2020-02-15 LAB — BASIC METABOLIC PANEL
Anion gap: 12 (ref 5–15)
BUN: 13 mg/dL (ref 6–20)
CO2: 28 mmol/L (ref 22–32)
Calcium: 9.1 mg/dL (ref 8.9–10.3)
Chloride: 95 mmol/L — ABNORMAL LOW (ref 98–111)
Creatinine, Ser: 0.82 mg/dL (ref 0.61–1.24)
GFR, Estimated: >60 mL/min (ref >60)
Glucose, Bld: 93 mg/dL (ref 70–99)
Potassium: 3.7 mmol/L (ref 3.5–5.1)
Sodium: 135 mmol/L (ref 135–145)

## 2020-02-15 LAB — GLUCOSE, CAPILLARY
Glucose-Capillary: 76 mg/dL (ref 70–99)
Glucose-Capillary: 120 mg/dL — ABNORMAL HIGH (ref 70–99)

## 2020-02-15 MED ORDER — GUAIFENESIN-DM 100-10 MG/5ML PO SYRP: 10.0000 mL | ORAL_SOLUTION | ORAL | 0 refills | Status: DC | PRN

## 2020-02-15 MED ORDER — METFORMIN HCL ER (OSM) 500 MG PO TB24: ORAL_TABLET | ORAL | 0 refills | Status: DC

## 2020-02-15 MED ORDER — INSULIN ASPART 100 UNIT/ML ~~LOC~~ SOLN: 0.0000 [IU] | Freq: Three times a day (TID) | SUBCUTANEOUS | Status: DC

## 2020-02-15 MED FILL — ROBAFEN DM CGH-CHEST CONG S: 10-100 | 3 days supply | Qty: 118 | Fill #0

## 2020-02-15 MED FILL — METFORMIN HCL ER 500 MG TB2: 500 | 14 days supply | Qty: 21 | Fill #0

## 2020-02-15 NOTE — Discharge Summary (Signed)
Name: Raymond Lewis MRN: [id number] DOB: [birth year] 60 y.o. PCP: Patient, No Pcp Per  Date of Admission: 02/04/2020 12:56 AM Date of Discharge: 02/15/2020 Attending Physician: Tyson Alias, *  Discharge Diagnosis: 1. Principal Problem:   Pneumonia due to COVID-19 virus Active Problems:   Diabetes (HCC)   AKI (acute kidney injury) (HCC)   Hypoxia  Discharge Medications: Allergies as of 02/15/2020   No Known Allergies     Medication List    TAKE these medications   guaiFENesin-dextromethorphan 100-10 MG/5ML syrup Commonly known as: ROBITUSSIN DM Take 10 mLs by mouth every 4 (four) hours as needed for cough.   metformin 500 MG (OSM) 24 hr tablet Commonly known as: FORTAMET Take 1 tablet (500 mg total) by mouth daily with breakfast for 7 days, THEN 2 tablets (1,000 mg total) daily with breakfast for 7 days. Start taking on: February 15, 2020            Durable Medical Equipment  (From admission, onward)         Start     Ordered   02/15/20 1324  DME Oxygen  Once       Question Answer Comment  Length of Need 6 Months   Mode or (Route) Nasal cannula   Liters per Minute 4   Frequency Continuous (stationary and portable oxygen unit needed)   Oxygen conserving device Yes   Oxygen delivery system Gas      02/15/20 1325         Disposition and follow-up:   Raymond Lewis was discharged from [facility] in [place] condition.  At the hospital follow up visit please address:  1.  Problem List: #COVID-19: Patient tested positive on 02/04/20, he required up to 15L HFNC. Patient was discharged on 4L with plan to follow-up with clinic to reassess oxygen needs. Please assess patient's oxygen requirement following nine additional days of isolation.  #T2DM: Patient found to have HbA1c of 7.8 on admission. Patient started on Lantus and SSI during hospitalization. Blood glucose readings likely exacerbated by active infection and dexamethasone use.  Patient discharged on metformin 500mg  every morning with plan to increase to 1000mg  every morning in one week. He reported understanding, however there is concern that patient may be nonadherent to prescribed medication. Please assess tolerance of medication and consider titrating up further.  2.  Labs / imaging needed at time of follow-up: BMP, blood glucose  3.  Pending labs/ test needing follow-up: None  Follow-up Appointments:  Follow-up Information    [facility] Follow up in 1 week(s).   Contact information: [address] [place] [address] [place]             Hospital Course by problem list: #COVID-19: Patient presented to [facility] on 02/04/20 and tested positive for COVID. He had a gradual increase in his oxygen requirement to 15L HFNC despite daily dexamethasone administration. On day nine of dexamethasone use, patient began to have less oxygen requirement daily until day of discharge where he was able to maintain his oxygen saturation >90% on 4L nasal cannula while ambulating. He was discharged with home oxygen and robitussin.  #T2DM: Patient found to have HbA1c of 7.8 on admission. Patient started on Lantus and SSI during hospitalization with continued elevated blood glucose readings in the setting of active infection and dexamethasone use. Final two days prior to discharge, patient's blood glucose readings trending down following cessation of dexamethasone and improvement in his infection.  Due to shared decision making with patient, discharged on metformin 500mg  every morning for one week followed by 1000mg  metformin every morning for an additional week. Patient is to follow-up with clinic prior to running out of this prescription.  Discharge Vitals:   BP 115/70 (BP Location: Left Arm)   Pulse 99   Temp 98.2 F (36.8 C) (Oral)   Resp 20   Wt 87.8 kg   SpO2 97%   Pertinent Labs, Studies, and Procedures:   COVID positive -  02/04/20 Hemoglobin A1c 7.8 - 02/05/20  CBC Latest Ref Rng & Units 02/13/2020 02/12/2020 02/08/2020  WBC 4.0 - 10.5 K/uL 10.5 8.1 4.5  Hemoglobin 13.0 - 17.0 g/dL 16.114.4 09.614.6 04.516.1  Hematocrit 39.0 - 52.0 % 42.1 40.9 45.5  Platelets 150 - 400 K/uL 261 238 199   CMP Latest Ref Rng & Units 02/15/2020 02/14/2020 02/13/2020  Glucose 70 - 99 mg/dL 93 409(W224(H) 119(J197(H)  BUN 6 - 20 mg/dL 13 15 18   Creatinine 0.61 - 1.24 mg/dL 4.780.82 2.950.82 6.210.68  Sodium 135 - 145 mmol/L 135 136 134(L)  Potassium 3.5 - 5.1 mmol/L 3.7 4.5 4.4  Chloride 98 - 111 mmol/L 95(L) 97(L) 99  CO2 22 - 32 mmol/L 28 29 23   Calcium 8.9 - 10.3 mg/dL 9.1 9.5 9.6  Total Protein 6.5 - 8.1 g/dL - - -  Total Bilirubin 0.3 - 1.2 mg/dL - - -  Alkaline Phos 38 - 126 U/L - - -  AST 15 - 41 U/L - - -  ALT 0 - 44 U/L - - -   DG Chest 1 View  Result Date: 02/11/2020 CLINICAL DATA:  COVID-19 positive with recent pneumonia EXAM: CHEST  1 VIEW COMPARISON:  February 04, 2020 FINDINGS: There is airspace opacity in each lower lung region, increased from most recent study. Upper lung regions are clear. Heart size and pulmonary vascularity are normal. No adenopathy. No bone lesions. IMPRESSION: Increase in bibasilar airspace opacity consistent with multifocal pneumonia. Suspect atypical organism pneumonia, particularly given the clinical history. Heart size normal.  No evident adenopathy. Electronically Signed   By: Bretta BangWilliam  Woodruff III M.D.   On: 02/11/2020 10:31   DG Chest Portable 1 View  Result Date: 02/04/2020 CLINICAL DATA:  Shortness of breath, cough, COVID-19 positive EXAM: PORTABLE CHEST 1 VIEW COMPARISON:  Portable exam 1305 hours without priors for comparison FINDINGS: Normal heart size, mediastinal contours, and pulmonary vascularity. Patchy infiltrates in the mid to lower lungs bilaterally consistent with multifocal pneumonia and history of COVID-19. No pleural effusion or pneumothorax. Osseous structures unremarkable. IMPRESSION: BILATERAL pulmonary  infiltrates consistent with multifocal pneumonia and COVID-19. Electronically Signed   By: Ulyses SouthwardMark  Boles M.D.   On: 02/04/2020 13:14   ECHOCARDIOGRAM LIMITED  Result Date: 02/12/2020    ECHOCARDIOGRAM LIMITED REPORT   Patient Name:   Raymond Lewis Date of Exam: 02/12/2020 Medical Rec #:  [id number]     Height: Accession #:    [id number]    Weight:       193.6 lb Date of Birth:  08/20/1960      BSA:          1.976 m Patient Age:    59 years      BP:           107/84 mmHg Patient Gender: M             HR:           81 bpm. Exam Location:  Inpatient Procedure:  Limited Echo, Cardiac Doppler and Color Doppler Indications:    Acutre respiratory distress R06.03  History:        Patient has no prior history of Echocardiogram examinations.  Sonographer:    Renella Cunas RDCS Referring Phys: [id number] [facility]  Sonographer Comments: Covid positive. IMPRESSIONS  1. Left ventricular ejection fraction, by estimation, is 60 to 65%. The left ventricle has normal function. Left ventricular diastolic parameters are consistent with Grade II diastolic dysfunction (pseudonormalization).  2. Right ventricular systolic function is normal. The right ventricular size is normal. Tricuspid regurgitation signal is inadequate for assessing PA pressure.  3. The mitral valve is normal in structure. No evidence of mitral valve regurgitation. No evidence of mitral stenosis.  4. The aortic valve is tricuspid. Aortic valve regurgitation is not visualized. No aortic stenosis is present.  5. Aortic dilatation noted. There is mild dilatation of the aortic root, measuring 38 mm.  6. The inferior vena cava is normal in size with greater than 50% respiratory variability, suggesting right atrial pressure of 3 mmHg. FINDINGS  Left Ventricle: Left ventricular ejection fraction, by estimation, is 60 to 65%. The left ventricle has normal function. The left ventricular internal cavity size was normal in size. There is no left ventricular hypertrophy.  Left ventricular diastolic parameters are consistent with Grade II diastolic dysfunction (pseudonormalization). Right Ventricle: The right ventricular size is normal. No increase in right ventricular wall thickness. Right ventricular systolic function is normal. Tricuspid regurgitation signal is inadequate for assessing PA pressure. Left Atrium: Left atrial size was normal in size. Right Atrium: Right atrial size was normal in size. Pericardium: Trivial pericardial effusion is present. Mitral Valve: The mitral valve is normal in structure. No evidence of mitral valve stenosis. Tricuspid Valve: The tricuspid valve is normal in structure. Tricuspid valve regurgitation is not demonstrated. Aortic Valve: The aortic valve is tricuspid. Aortic valve regurgitation is not visualized. No aortic stenosis is present. Aorta: Aortic dilatation noted. There is mild dilatation of the aortic root, measuring 38 mm. Venous: The inferior vena cava is normal in size with greater than 50% respiratory variability, suggesting right atrial pressure of 3 mmHg. IAS/Shunts: No atrial level shunt detected by color flow Doppler. LEFT VENTRICLE PLAX 2D LVIDd:         5.20 cm      Diastology LVIDs:         3.40 cm      LV e' medial:    7.72 cm/s LV PW:         0.90 cm      LV E/e' medial:  7.8 LV IVS:        0.90 cm      LV e' lateral:   7.10 cm/s LVOT diam:     2.30 cm      LV E/e' lateral: 8.5 LV SV:         61 LV SV Index:   31 LVOT Area:     4.15 cm  LV Volumes (MOD) LV vol d, MOD A2C: 97.4 ml LV vol d, MOD A4C: 102.0 ml LV vol s, MOD A2C: 37.8 ml LV vol s, MOD A4C: 41.3 ml LV SV MOD A2C:     59.6 ml LV SV MOD A4C:     102.0 ml LV SV MOD BP:      62.3 ml RIGHT VENTRICLE RV S prime:     15.20 cm/s TAPSE (M-mode): 2.7 cm LEFT ATRIUM  Index LA diam:      3.70 cm 1.87 cm/m LA Vol (A4C): 18.8 ml 9.52 ml/m  AORTIC VALVE LVOT Vmax:   97.00 cm/s LVOT Vmean:  64.000 cm/s LVOT VTI:    0.147 m  AORTA Ao Root diam: 3.80 cm Ao Asc diam:  3.30  cm MITRAL VALVE MV Area (PHT): 3.21 cm    SHUNTS MV Decel Time: 236 msec    Systemic VTI:  0.15 m MV E velocity: 60.60 cm/s  Systemic Diam: 2.30 cm MV A velocity: 45.60 cm/s MV E/A ratio:  1.33 Marca Ancona MD Electronically signed by Marca Ancona MD Signature Date/Time: 02/12/2020/4:44:49 PM    Final    Discharge Instructions: Discharge Instructions    Call MD for:  difficulty breathing, headache or visual disturbances   Complete by: As directed    Call MD for:  extreme fatigue   Complete by: As directed    Call MD for:  hives   Complete by: As directed    Call MD for:  persistant dizziness or light-headedness   Complete by: As directed    Call MD for:  persistant nausea and vomiting   Complete by: As directed    Call MD for:  redness, tenderness, or signs of infection (pain, swelling, redness, odor or green/yellow discharge around incision site)   Complete by: As directed    Call MD for:  severe uncontrolled pain   Complete by: As directed    Call MD for:  temperature >100.4   Complete by: As directed    Diet Carb Modified   Complete by: As directed    Increase activity slowly   Complete by: As directed      Signed: Roylene Reason, MD 02/15/2020, 2:16 PM   Pager: [phone]

## 2020-02-15 NOTE — Progress Notes (Signed)
SATURATION QUALIFICATIONS: (This note is used to comply with regulatory documentation for home oxygen)  Patient Saturations on Room Air at Rest = 88%  Patient Saturations on Room Air while Ambulating =84%  Patient Saturations on4Liters of oxygen while Ambulating =92%  Please briefly explain why patient needs home oxygen:

## 2020-02-15 NOTE — TOC Transition Note (Signed)
Transition of Care (TOC) - CM/SW Discharge Note Donn Pierini RN, BSN Transitions of Care Unit 4E- RN Case Manager See Treatment Team for direct phone #    Patient Details  Name: Raymond Lewis MRN: [id number] Date of Birth: 1960-06-04  Transition of Care [facility]) CM/SW Contact:  Darrold Span, RN Phone Number: 02/15/2020, 3:58 PM   Clinical Narrative:    Pt stable for transition home today, order placed for home 02- pt is COVID + and uninsured. Call made to [facility] with [employer]- for home 02 needs  Under charity program. Ian Malkin to f/u on referral and deliver portable tanks to room. Delivery of home equipment later today once pt is home.  Call made to room to speak with pt- confirmed with pt address for home 02 delivery and transport home. Per pt he will be home alone, and does not have anyone to transport home, wife is still here in hospital also with COVID. Per PT recs will also arrange RW for patient with [employer]- pt reports he does not have one at home. Explained to pt regarding DME arrangements and 02 arrangements- also explained about transport home- unit will arrange transport home with  [facility] (have called and confirmed they will transport +COVID) pt will be ready once RW has been delivered- per [employer] portable 02 tanks have already been delivered to the unit for pt.   Pt to f/u with IM clinic. [employer] on stand by for home 02 delivery.    Final next level of care: Home/Self Care Barriers to Discharge: No Barriers Identified   Patient Goals and CMS Choice    N/A- charity    Discharge Placement                   Home    Discharge Plan and Services   Discharge Planning Services: CM Consult Post Acute Care Choice: Durable Medical Equipment          DME Arranged: Walker rolling DME Agency: [employer] Date DME Agency Contacted: 02/15/20 Time DME Agency Contacted: 1359 Representative spoke with at DME Agency: Ian Malkin HH Arranged: NA HH Agency: NA        Social  Determinants of Health (SDOH) Interventions     Readmission Risk Interventions Readmission Risk Prevention Plan 02/15/2020  Post Dischage Appt Complete  Medication Screening Complete  Transportation Screening Complete  Some recent data might be hidden

## 2020-02-15 NOTE — Discharge Instructions (Signed)
Mr. Raymond Lewis,  It was a pleasure meeting you during your recent hospitalization.   For your COVID-19 infection: you will have to continue isolating from others until January 23rd. We would like for you to be evaluated in the Internal Medicine clinic after this date to see how you are recovering from your hospitalization. We will be sending you home with supplemental oxygen which we would like for you to continue until your clinic visit.  For your diabetes: you were newly diagnosed with diabetes on your admission. You will need to continue treating this condition following discharge from the hospital. Follow closely with our clinic for continued evaluation and management.  Sincerely, Dr. Jasmine December, MD

## 2020-02-15 NOTE — Progress Notes (Signed)
Subjective:  Overnight, no acute events.  This morning, patient reports that he feels well and denies any new complaints or concerns. He denies shortness of breath and he reports that his cough is improving. He states that yesterday he was able to walk around the unit well. He reports that his wife is still hospitalized with COVID. He reports that if he were to discharge from the hospital that he would be able to wear oxygen without complication at home. He has no further questions or concerns.  Objective:  Vital signs in last 24 hours: Vitals:   02/14/20 1152 02/14/20 2056 02/15/20 0020 02/15/20 1015  BP: 113/72 104/74 115/70   Pulse: 99 93 90 99  Resp: 18 16 20 20   Temp: 98.4 F (36.9 C) 98.4 F (36.9 C) 98.2 F (36.8 C)   TempSrc: Oral Oral Oral   SpO2: 95% 100% 97%   Weight:      SpO2: 97 % O2 Flow Rate (L/min): 4 L/min  Intake/Output Summary (Last 24 hours) at 02/15/2020 1206 Last data filed at 02/14/2020 2200 Gross per 24 hour  Intake 120 ml  Output --  Net 120 ml   Filed Weights   02/09/20 0324 02/10/20 0505 02/12/20 0449  Weight: 87.9 kg 87.5 kg 87.8 kg   Physical Exam Constitutional:      General: He is not in acute distress. Cardiovascular:     Rate and Rhythm: Normal rate and regular rhythm.     Pulses: Normal pulses.     Heart sounds: Normal heart sounds.  Pulmonary:     Effort: Pulmonary effort is normal. No respiratory distress.  Abdominal:     General: Abdomen is flat. Bowel sounds are normal.     Palpations: Abdomen is soft.     Tenderness: There is no abdominal tenderness.  Musculoskeletal:        General: Normal range of motion.     Right lower leg: No edema.     Left lower leg: No edema.  Skin:    General: Skin is warm and dry.     Capillary Refill: Capillary refill takes less than 2 seconds.  Neurological:     General: No focal deficit present.     Mental Status: Mental status is at baseline.  Psychiatric:        Mood and Affect: Mood  normal.        Behavior: Behavior normal.        Thought Content: Thought content normal.        Judgment: Judgment normal.    Labs in past 24 hours: BMP Latest Ref Rng & Units 02/15/2020 02/14/2020 02/13/2020  Glucose 70 - 99 mg/dL 93 04/12/2020) 357(S)  BUN 6 - 20 mg/dL 13 15 18   Creatinine 0.61 - 1.24 mg/dL 177(L 3.90  Sodium 135 - 145 mmol/L 135 136 134(L)  Potassium 3.5 - 5.1 mmol/L 3.7 4.5 4.4  Chloride 98 - 111 mmol/L 95(L) 97(L) 99  CO2 22 - 32 mmol/L 28 29 23   Calcium 8.9 - 10.3 mg/dL 9.1 9.5 9.6   Glucose: 76, 136, 259, 277  Imaging in past 24 hours: No results found.   Assessment/Plan:  Principal Problem:   Pneumonia due to COVID-19 virus Active Problems:   Diabetes (HCC)   AKI (acute kidney injury) (HCC)   Hypoxia  Raymond Lewis is a 60 year old male with no significant past medical history who was admitted 02/04/20 for acute hypoxic respiratory failure 2/2 COVID pneumonia.  #Acute hypoxic respiratory  failure due to COVID-19 pneumonia, active Patient completed his course of dexamethasone on 02/13/20. He has had a slow titration down of his oxygen requirement from 15Ln HFNC. At rest on our evaluation, he is able to maintain his oxygen saturation >90% on room air. He would benefit from checking SpO2 while ambulating to determine whether he requires supplemental oxygen upon discharge. -Continue supplemental oxygen, as needed, to maintain SpO2 >92% -Check pulse oximetry while ambulating -Continue incentive spirometry -Robitussin Q4H PRN, Tussionex Q12H PRN   #Type 2 diabetes mellitus, new, chronic Hemoglobin of 7.8 on admission. No prior history of diabetes. Blood sugars were likely exacerbated in the setting of dexamethasone use and COVID-19 infection. Blood sugars lower today following completion of dexamethasone. -Continue Lantus 18 units nightly -Decrease SSI to moderate (0-15 units) from resistant (0-20 units) -CBG monitoring  DVT prophx: Rivaroxabban as patient  declined enoxaparin injections Diet: Carb modified Bowel: Miralax 17g twice daily, Senna-docusate 2 tablet twice daily Code status: Full code  Prior to Admission Living Arrangement: Home Anticipated Discharge Location: Home Barriers to Discharge: Hypoxia Dispo: Anticipated discharge in approximately 0-1 day(s).   Jasmine December, MD Internal Medicine PGY-1 Pager: [phone] After 5pm on weekdays and 1pm on weekends: On Call pager [phone]  02/15/2020, 12:06 PM

## 2020-02-15 NOTE — Plan of Care (Signed)

## 2020-02-20 ENCOUNTER — Telehealth: Payer: Self-pay

## 2020-02-20 NOTE — Telephone Encounter (Signed)
Pt wife call to get an Hospital f/u  Date given was 03/04/20 @ 9:45 with DR Imogene Burn

## 2020-02-26 ENCOUNTER — Telehealth: Payer: Self-pay | Admitting: Nurse Practitioner

## 2020-02-26 ENCOUNTER — Telehealth (INDEPENDENT_AMBULATORY_CARE_PROVIDER_SITE_OTHER): Payer: HRSA Program | Admitting: Nurse Practitioner

## 2020-02-26 DIAGNOSIS — R739 Hyperglycemia, unspecified: Secondary | ICD-10-CM | POA: Diagnosis not present

## 2020-02-26 DIAGNOSIS — J1282 Pneumonia due to coronavirus disease 2019: Secondary | ICD-10-CM

## 2020-02-26 DIAGNOSIS — U071 COVID-19: Secondary | ICD-10-CM

## 2020-02-26 DIAGNOSIS — J9601 Acute respiratory failure with hypoxia: Secondary | ICD-10-CM | POA: Diagnosis not present

## 2020-02-26 NOTE — Progress Notes (Signed)
Virtual Visit via Telephone Note  I connected with Raymond Lewis on 02/26/20 at  9:00 AM EST by telephone and verified that I am speaking with the correct person using two identifiers.  Location: Patient: home Provider: office   I discussed the limitations, risks, security and privacy concerns of performing an evaluation and management service by telephone and the availability of in person appointments. I also discussed with the patient that there may be a patient responsible charge related to this service. The patient expressed understanding and agreed to proceed.  Chief Complaint  Patient presents with  . Hospitalization Follow-up    COVID +1/3; Breathing issues, 3L PRN does not check stats       History of Present Illness:  Patient presents today for post COVID care clinic visit/hospital follow-up through televisit.  Patient was admitted to the hospital on 02/04/2020 and discharged on 02/15/2020.  Patient was admitted for COVID pneumonia and respiratory failure.  Patient is currently using 3 L of O2 continuously.  He has not been checking his O2 sats.  His wife did check his O2 sats while he was on the phone today and he states that he is at 94% on 3 L.  While admitted patient was found to have a hemoglobin A1c of 7.8 on admission.  Patient was discharged home on Metformin 500 mg every morning with plan to increase to 1000 mg every morning after 1 week.  Patient states that he has been taking 500 mg daily but was not aware that he needed to increase dosage.  He states that his blood sugars have been running 164 but he has only checked his blood sugar once in the past couple weeks. Patient states that he has never had issues with blood sugar, but also states that he has not had a physical in the past 10 years.  Said he does not actually know what his blood sugars have been over the past several years.  Patient does not currently have a PCP.  We discussed that we will get him set up to establish  care with primary care physician to be followed for his blood sugars. Denies f/c/s, n/v/d, hemoptysis, PND, chest pain or edema.     Observations/Objective:  Vitals with BMI 02/15/2020 02/15/2020 02/14/2020  Weight - - -  Systolic - 115 104  Diastolic - 70 74  Pulse 99 90 93      Assessment and Plan:  Covid pneumonia Acute hypoxic respiratory failure:   Stay well hydrated  Stay active  Deep breathing exercises  May take tylenol for fever or pain  May take mucinex twice daily  Continue O2 - please keep a check on O2 sats and wean O2 if sats remain above 93%   Hyperglycemia:  Please follow diabetic diet  Continue metformin  Please keep blood sugar log    Follow Up Instructions:  Follow up in 1 week or sooner if needed    I discussed the assessment and treatment plan with the patient. The patient was provided an opportunity to ask questions and all were answered. The patient agreed with the plan and demonstrated an understanding of the instructions.   The patient was advised to call back or seek an in-person evaluation if the symptoms worsen or if the condition fails to improve as anticipated.  I provided 22 minutes of non-face-to-face time during this encounter.   Ivonne Andrew, NP

## 2020-02-26 NOTE — Patient Instructions (Addendum)
Covid pneumonia Acute hypoxic respiratory failure:   Stay well hydrated  Stay active  Deep breathing exercises  May take tylenol for fever or pain  May take mucinex twice daily  Continue O2 - please keep a check on O2 sats and wean O2 if sats remain above 93%   Hyperglycemia:  Please follow diabetic diet  Continue metformin  Please keep blood sugar log      Follow up:  Follow up in 1 week or sooner if needed

## 2020-02-27 NOTE — Telephone Encounter (Signed)
Spoke to pt spouse Nettie Elm to schedule pt with new PCP to establish care due to pt has not had PCP in 10 years.

## 2020-02-28 ENCOUNTER — Telehealth: Payer: Self-pay

## 2020-02-28 NOTE — Telephone Encounter (Signed)
Transition Care Management Unsuccessful Follow-up Telephone Call  Date of discharge and from where:  02/15/2020 Attempts:  1st Attempt  Reason for unsuccessful TCM follow-up call:  Unable to leave message, mailbox was full

## 2020-03-03 NOTE — Telephone Encounter (Signed)
Pt aware appt has been changed to 03/10/2020 @ 3:15 pm per request.

## 2020-03-04 ENCOUNTER — Encounter: Payer: Self-pay | Admitting: Student

## 2020-03-05 ENCOUNTER — Ambulatory Visit (INDEPENDENT_AMBULATORY_CARE_PROVIDER_SITE_OTHER): Payer: HRSA Program | Admitting: Nurse Practitioner

## 2020-03-05 VITALS — BP 128/74 | HR 111 | Temp 98.3°F | Ht 73.0 in | Wt 198.0 lb

## 2020-03-05 DIAGNOSIS — E559 Vitamin D deficiency, unspecified: Secondary | ICD-10-CM | POA: Diagnosis not present

## 2020-03-05 DIAGNOSIS — J1282 Pneumonia due to coronavirus disease 2019: Secondary | ICD-10-CM

## 2020-03-05 DIAGNOSIS — U071 COVID-19: Secondary | ICD-10-CM

## 2020-03-05 DIAGNOSIS — J9601 Acute respiratory failure with hypoxia: Secondary | ICD-10-CM | POA: Diagnosis not present

## 2020-03-05 NOTE — Patient Instructions (Signed)
Covid pneumonia:   Stay well hydrated  Stay active  Deep breathing exercises  May take tylenol or fever or pain  May take mucinex  twice daily  Will order labs  May wean O2 to keep sats above 93%  Will order chest x ray:  [facility] [address] [place], [place] [address] [phone] MON - FRI 8:00 AM - 4:00 PM - WALK IN   Follow up:  Follow up in 2 weeks or sooner if needed

## 2020-03-05 NOTE — Progress Notes (Signed)
@Patient  ID: , male    DOB: 22-May-1960, 60 y.o.   MRN: [id number]  Chief Complaint  Patient presents with  . Follow-up    1 week follow up, 3L still having a cough,some breathing issues    Referring provider: No ref. provider found  59 year old male with history of diabetes, AKI, hyperglycemia.  HPI  Patient presents today for post COVID care clinic visit follow-up.  He was last seen through televisit on 02/26/2020 for hospital follow-up.  Patient states that he continues to improve slowly.  He is still on 3 L of O2 continuously.  He is checking O2 sats at home.  He was noted to have a hemoglobin A1c of 7.8 on admission to the hospital.  Patient has not been checking his blood sugars at home frequently.  We have set him up appointment to establish care with new primary care physician to follow him for possible diabetes. Denies f/c/s, n/v/d, hemoptysis, PND, chest pain or edema.     No Known Allergies   There is no immunization history on file for this patient.  Past Medical History:  Diagnosis Date  . COVID-19     Tobacco History: Social History   Tobacco Use  Smoking Status Never Smoker  Smokeless Tobacco Never Used   Counseling given: Not Answered   Outpatient Encounter Medications as of 03/05/2020  Medication Sig  . guaiFENesin-dextromethorphan (ROBITUSSIN DM) 100-10 MG/5ML syrup Take 10 mLs by mouth every 4 (four) hours as needed for cough. (Patient not taking: Reported on 03/05/2020)  . metformin (FORTAMET) 500 MG (OSM) 24 hr tablet Take 1 tablet (500 mg total) by mouth daily with breakfast for 7 days, THEN 2 tablets (1,000 mg total) daily with breakfast for 7 days.   No facility-administered encounter medications on file as of 03/05/2020.     Review of Systems  Review of Systems  Constitutional: Negative.  Negative for fever.  HENT: Negative.   Respiratory: Positive for cough and shortness of breath.   Cardiovascular: Negative.  Negative for chest  pain, palpitations and leg swelling.  Gastrointestinal: Negative.   Allergic/Immunologic: Negative.   Neurological: Negative.   Psychiatric/Behavioral: Negative.        Physical Exam  BP 128/74 (BP Location: Left Arm)   Pulse (!) 111   Temp 98.3 F (36.8 C)   Ht 6\' 1"  (1.854 m)   Wt 198 lb (89.8 kg)   SpO2 97%   BMI 26.12 kg/m   Wt Readings from Last 5 Encounters:  03/05/20 198 lb (89.8 kg)  02/12/20 193 lb 9 oz (87.8 kg)     Physical Exam Vitals and nursing note reviewed.  Constitutional:      General: He is not in acute distress.    Appearance: He is well-developed and well-nourished.  Cardiovascular:     Rate and Rhythm: Normal rate and regular rhythm.  Pulmonary:     Effort: Pulmonary effort is normal.     Breath sounds: Normal breath sounds.  Musculoskeletal:     Right lower leg: No edema.     Left lower leg: No edema.  Skin:    General: Skin is warm and dry.  Neurological:     Mental Status: He is alert and oriented to person, place, and time.  Psychiatric:        Mood and Affect: Mood and affect and mood normal.        Behavior: Behavior normal.       Imaging: DG Chest 1 View  Result Date: 02/11/2020 CLINICAL DATA:  COVID-19 positive with recent pneumonia EXAM: CHEST  1 VIEW COMPARISON:  February 04, 2020 FINDINGS: There is airspace opacity in each lower lung region, increased from most recent study. Upper lung regions are clear. Heart size and pulmonary vascularity are normal. No adenopathy. No bone lesions. IMPRESSION: Increase in bibasilar airspace opacity consistent with multifocal pneumonia. Suspect atypical organism pneumonia, particularly given the clinical history. Heart size normal.  No evident adenopathy. Electronically Signed   By: Bretta Bang III M.D.   On: 02/11/2020 10:31   ECHOCARDIOGRAM LIMITED  Result Date: 02/12/2020    ECHOCARDIOGRAM LIMITED REPORT   Patient Name:   MAKSYMILIAN MABEY Date of Exam: 02/12/2020 Medical Rec #:  [id number]      Height: Accession #:    [id number]    Weight:       193.6 lb Date of Birth:  Oct 31, 1960      BSA:          1.976 m Patient Age:    59 years      BP:           107/84 mmHg Patient Gender: M             HR:           81 bpm. Exam Location:  Inpatient Procedure: Limited Echo, Cardiac Doppler and Color Doppler Indications:    Acutre respiratory distress R06.03  History:        Patient has no prior history of Echocardiogram examinations.  Sonographer:    Renella Cunas RDCS Referring Phys: [id number] [facility]INCENT  Sonographer Comments: Covid positive. IMPRESSIONS  1. Left ventricular ejection fraction, by estimation, is 60 to 65%. The left ventricle has normal function. Left ventricular diastolic parameters are consistent with Grade II diastolic dysfunction (pseudonormalization).  2. Right ventricular systolic function is normal. The right ventricular size is normal. Tricuspid regurgitation signal is inadequate for assessing PA pressure.  3. The mitral valve is normal in structure. No evidence of mitral valve regurgitation. No evidence of mitral stenosis.  4. The aortic valve is tricuspid. Aortic valve regurgitation is not visualized. No aortic stenosis is present.  5. Aortic dilatation noted. There is mild dilatation of the aortic root, measuring 38 mm.  6. The inferior vena cava is normal in size with greater than 50% respiratory variability, suggesting right atrial pressure of 3 mmHg. FINDINGS  Left Ventricle: Left ventricular ejection fraction, by estimation, is 60 to 65%. The left ventricle has normal function. The left ventricular internal cavity size was normal in size. There is no left ventricular hypertrophy. Left ventricular diastolic parameters are consistent with Grade II diastolic dysfunction (pseudonormalization). Right Ventricle: The right ventricular size is normal. No increase in right ventricular wall thickness. Right ventricular systolic function is normal. Tricuspid regurgitation signal is  inadequate for assessing PA pressure. Left Atrium: Left atrial size was normal in size. Right Atrium: Right atrial size was normal in size. Pericardium: Trivial pericardial effusion is present. Mitral Valve: The mitral valve is normal in structure. No evidence of mitral valve stenosis. Tricuspid Valve: The tricuspid valve is normal in structure. Tricuspid valve regurgitation is not demonstrated. Aortic Valve: The aortic valve is tricuspid. Aortic valve regurgitation is not visualized. No aortic stenosis is present. Aorta: Aortic dilatation noted. There is mild dilatation of the aortic root, measuring 38 mm. Venous: The inferior vena cava is normal in size with greater than 50% respiratory variability, suggesting right atrial pressure of 3 mmHg. IAS/Shunts:  No atrial level shunt detected by color flow Doppler. LEFT VENTRICLE PLAX 2D LVIDd:         5.20 cm      Diastology LVIDs:         3.40 cm      LV e' medial:    7.72 cm/s LV PW:         0.90 cm      LV E/e' medial:  7.8 LV IVS:        0.90 cm      LV e' lateral:   7.10 cm/s LVOT diam:     2.30 cm      LV E/e' lateral: 8.5 LV SV:         61 LV SV Index:   31 LVOT Area:     4.15 cm  LV Volumes (MOD) LV vol d, MOD A2C: 97.4 ml LV vol d, MOD A4C: 102.0 ml LV vol s, MOD A2C: 37.8 ml LV vol s, MOD A4C: 41.3 ml LV SV MOD A2C:     59.6 ml LV SV MOD A4C:     102.0 ml LV SV MOD BP:      62.3 ml RIGHT VENTRICLE RV S prime:     15.20 cm/s TAPSE (M-mode): 2.7 cm LEFT ATRIUM           Index LA diam:      3.70 cm 1.87 cm/m LA Vol (A4C): 18.8 ml 9.52 ml/m  AORTIC VALVE LVOT Vmax:   97.00 cm/s LVOT Vmean:  64.000 cm/s LVOT VTI:    0.147 m  AORTA Ao Root diam: 3.80 cm Ao Asc diam:  3.30 cm MITRAL VALVE MV Area (PHT): 3.21 cm    SHUNTS MV Decel Time: 236 msec    Systemic VTI:  0.15 m MV E velocity: 60.60 cm/s  Systemic Diam: 2.30 cm MV A velocity: 45.60 cm/s MV E/A ratio:  1.33 Marca Ancona MD Electronically signed by Marca Ancona MD Signature Date/Time: 02/12/2020/4:44:49 PM     Final      Assessment & Plan:   Pneumonia due to COVID-19 virus Stay well hydrated  Stay active  Deep breathing exercises  May take tylenol or fever or pain  May take mucinex  twice daily  Will order labs  May wean O2 to keep sats above 93%  Will order chest x ray:  [facility] [address] [place], [place] [address] [phone] MON - FRI 8:00 AM - 4:00 PM - WALK IN   Follow up:  Follow up in 2 weeks or sooner if needed      Ivonne Andrew, NP 03/06/2020

## 2020-03-06 LAB — CBC
Hematocrit: 40.6 % (ref 37.5–51.0)
Hemoglobin: 14.1 g/dL (ref 13.0–17.7)
MCH: 31.1 pg (ref 26.6–33.0)
MCHC: 34.7 g/dL (ref 31.5–35.7)
MCV: 89 fL (ref 79–97)
Platelets: 221 10*3/uL (ref 150–450)
RBC: 4.54 x10E6/uL (ref 4.14–5.80)
RDW: 13 % (ref 11.6–15.4)
WBC: 4.9 10*3/uL (ref 3.4–10.8)

## 2020-03-06 LAB — COMPREHENSIVE METABOLIC PANEL
ALT: 20 IU/L (ref 0–44)
AST: 14 IU/L (ref 0–40)
Albumin/Globulin Ratio: 1.2 (ref 1.2–2.2)
Albumin: 3.8 g/dL (ref 3.8–4.9)
Alkaline Phosphatase: 90 IU/L (ref 44–121)
BUN/Creatinine Ratio: 7 — ABNORMAL LOW (ref 9–20)
BUN: 5 mg/dL — ABNORMAL LOW (ref 6–24)
Bilirubin Total: 1 mg/dL (ref 0.0–1.2)
CO2: 21 mmol/L (ref 20–29)
Calcium: 9.5 mg/dL (ref 8.7–10.2)
Chloride: 103 mmol/L (ref 96–106)
Creatinine, Ser: 0.71 mg/dL — ABNORMAL LOW (ref 0.76–1.27)
GFR calc Af Amer: 120 mL/min/{1.73_m2} (ref >59)
GFR calc non Af Amer: 104 mL/min/{1.73_m2} (ref >59)
Globulin, Total: 3.2 g/dL (ref 1.5–4.5)
Glucose: 187 mg/dL — ABNORMAL HIGH (ref 65–99)
Potassium: 4.4 mmol/L (ref 3.5–5.2)
Sodium: 138 mmol/L (ref 134–144)
Total Protein: 7 g/dL (ref 6.0–8.5)

## 2020-03-06 LAB — VITAMIN D 25 HYDROXY (VIT D DEFICIENCY, FRACTURES): Vit D, 25-Hydroxy: 20.8 ng/mL — ABNORMAL LOW (ref 30.0–100.0)

## 2020-03-06 NOTE — Assessment & Plan Note (Signed)
Stay well hydrated  Stay active  Deep breathing exercises  May take tylenol or fever or pain  May take mucinex twice daily  Will order labs  May wean O2 to keep sats above 93%  Will order chest x ray:  [facility] [address] [place], [place] [address] [phone] MON - FRI 8:00 AM - 4:00 PM - WALK IN   Follow up:  Follow up in 2 weeks or sooner if needed  

## 2020-03-10 ENCOUNTER — Encounter: Payer: Self-pay | Admitting: Student

## 2020-03-10 NOTE — Progress Notes (Signed)
Left voice mail to call back 

## 2020-03-11 NOTE — Telephone Encounter (Signed)
[facility] appts 02/01 and 02/07 canceled by pt/wife-. Pt has an appt scheduled for 02/17 with new provider and has been followed up by the post-covid clinic.  No further action needed at this time, phone call complete .Kingsley Spittle Cassady2/8/20222:12 PM

## 2020-03-12 ENCOUNTER — Ambulatory Visit: Payer: Self-pay | Admitting: Nurse Practitioner

## 2020-03-18 ENCOUNTER — Other Ambulatory Visit: Payer: Self-pay

## 2020-03-18 ENCOUNTER — Ambulatory Visit: Payer: Self-pay

## 2020-03-18 ENCOUNTER — Ambulatory Visit
Admission: RE | Admit: 2020-03-18 | Discharge: 2020-03-18 | Disposition: A | Discharge status: Home / Self Care | Payer: No Typology Code available for payment source | Source: Ambulatory Visit | Attending: Nurse Practitioner | Admitting: Nurse Practitioner

## 2020-03-20 ENCOUNTER — Ambulatory Visit: Payer: Self-pay | Admitting: Nurse Practitioner

## 2020-03-26 ENCOUNTER — Other Ambulatory Visit: Payer: Self-pay

## 2020-03-26 ENCOUNTER — Ambulatory Visit (INDEPENDENT_AMBULATORY_CARE_PROVIDER_SITE_OTHER): Payer: HRSA Program | Admitting: Nurse Practitioner

## 2020-03-26 VITALS — BP 132/86 | HR 85 | Temp 97.7°F | Resp 18 | Ht 73.5 in | Wt 197.0 lb

## 2020-03-26 DIAGNOSIS — U071 COVID-19: Secondary | ICD-10-CM | POA: Diagnosis not present

## 2020-03-26 DIAGNOSIS — J9601 Acute respiratory failure with hypoxia: Secondary | ICD-10-CM | POA: Diagnosis not present

## 2020-03-26 DIAGNOSIS — J1282 Pneumonia due to coronavirus disease 2019: Secondary | ICD-10-CM | POA: Diagnosis not present

## 2020-03-26 NOTE — Progress Notes (Unsigned)
Patient reports feeling SOB especially with the mask on. Patient has not eaten and taken medication today. Patient denies pain at this time.

## 2020-03-26 NOTE — Patient Instructions (Addendum)
Covid pneumonia Cough:   Stay well hydrated  Stay active  Deep breathing exercises  May take tylenol or fever or pain  May take mucinex twice daily  Will place referral to pulmonary   Follow up:  Follow up in 4 weeks or sooner if needed

## 2020-03-26 NOTE — Progress Notes (Signed)
@Patient  ID: , male    DOB: 1960/02/14, 60 y.o.   MRN: [id number]  Chief Complaint  Patient presents with  . Follow-up    2wk Pneumonia    Referring provider: No ref. provider found  HPI  Patient presents today for post COVID care clinic visit follow-up.  Patient states that he is much improved.  He has returned to work for half day shifts.  For the most part patient is on room air throughout the day but does have to use O2 on occasion with exertion.  He is eating well and stay well-hydrated.  His last chest x-ray still noted diffuse pneumonia unchanged from previous x-ray.  We discussed that we will refer him to pulmonary.  We discussed prescribing another round of prednisone but patient refuses at this time.  He does not like to take a lot of medications. Denies f/c/s, n/v/d, hemoptysis, PND, chest pain or edema.     No Known Allergies   There is no immunization history on file for this patient.  Past Medical History:  Diagnosis Date  . COVID-19     Tobacco History: Social History   Tobacco Use  Smoking Status Never Smoker  Smokeless Tobacco Never Used   Counseling given: Not Answered   Outpatient Encounter Medications as of 03/26/2020  Medication Sig  . [DISCONTINUED] guaiFENesin-dextromethorphan (ROBITUSSIN DM) 100-10 MG/5ML syrup Take 10 mLs by mouth every 4 (four) hours as needed for cough. (Patient not taking: Reported on 03/05/2020)  . [DISCONTINUED] metformin (FORTAMET) 500 MG (OSM) 24 hr tablet Take 1 tablet (500 mg total) by mouth daily with breakfast for 7 days, THEN 2 tablets (1,000 mg total) daily with breakfast for 7 days.   No facility-administered encounter medications on file as of 03/26/2020.     Review of Systems  Review of Systems  Constitutional: Negative.  Negative for fatigue and fever.  HENT: Negative.   Respiratory: Positive for cough and shortness of breath.   Cardiovascular: Negative.  Negative for chest pain, palpitations and  leg swelling.  Gastrointestinal: Negative.   Allergic/Immunologic: Negative.   Neurological: Negative.   Psychiatric/Behavioral: Negative.        Physical Exam  BP 132/86 (BP Location: Left Arm, Patient Position: Sitting, Cuff Size: Large)   Pulse 85   Temp 97.7 F (36.5 C) (Temporal)   Resp 18   Ht 6' 1.5" (1.867 m)   Wt 197 lb (89.4 kg)   SpO2 100% Comment: 3L- 98%-RA  BMI 25.64 kg/m   Wt Readings from Last 5 Encounters:  03/27/20 200 lb (90.7 kg)  03/26/20 197 lb (89.4 kg)  03/05/20 198 lb (89.8 kg)  02/12/20 193 lb 9 oz (87.8 kg)     Physical Exam Vitals and nursing note reviewed.  Constitutional:      General: He is not in acute distress.    Appearance: He is well-developed and well-nourished.  Cardiovascular:     Rate and Rhythm: Normal rate and regular rhythm.  Pulmonary:     Effort: Pulmonary effort is normal.     Breath sounds: Normal breath sounds.  Musculoskeletal:     Right lower leg: No edema.     Left lower leg: Edema present.  Skin:    General: Skin is warm and dry.  Neurological:     Mental Status: He is alert and oriented to person, place, and time.  Psychiatric:        Mood and Affect: Mood and affect and mood normal.  Behavior: Behavior normal.      Imaging: DG Chest 2 View  Result Date: 03/19/2020 CLINICAL DATA:  Follow-up COVID pneumonia. EXAM: CHEST - 2 VIEW COMPARISON:  02/11/2020 FINDINGS: Mediastinum hilar structures normal. Heart size normal. Diffuse bilateral pulmonary infiltrates are again noted without interim change. No pleural effusion or pneumothorax. IMPRESSION: Diffuse bilateral pulmonary infiltrates, unchanged. Electronically Signed   By: Maisie Fus  Register   On: 03/19/2020 09:34     Assessment & Plan:   Pneumonia due to COVID-19 virus Cough:   Stay well hydrated  Stay active  Deep breathing exercises  May take tylenol or fever or pain  May take mucinex twice daily  Will place referral to  pulmonary   Follow up:  Follow up in 4 weeks or sooner if needed      Ivonne Andrew, NP 03/27/2020

## 2020-03-27 ENCOUNTER — Ambulatory Visit (INDEPENDENT_AMBULATORY_CARE_PROVIDER_SITE_OTHER): Payer: Self-pay | Admitting: Nurse Practitioner

## 2020-03-27 ENCOUNTER — Encounter: Payer: Self-pay | Admitting: Nurse Practitioner

## 2020-03-27 VITALS — BP 130/83 | HR 76 | Ht 73.5 in | Wt 200.0 lb

## 2020-03-27 DIAGNOSIS — R7309 Other abnormal glucose: Secondary | ICD-10-CM

## 2020-03-27 DIAGNOSIS — Z7689 Persons encountering health services in other specified circumstances: Secondary | ICD-10-CM | POA: Diagnosis not present

## 2020-03-27 DIAGNOSIS — Z8616 Personal history of COVID-19: Secondary | ICD-10-CM | POA: Diagnosis not present

## 2020-03-27 DIAGNOSIS — Z1159 Encounter for screening for other viral diseases: Secondary | ICD-10-CM | POA: Diagnosis not present

## 2020-03-27 DIAGNOSIS — Z114 Encounter for screening for human immunodeficiency virus [HIV]: Secondary | ICD-10-CM

## 2020-03-27 DIAGNOSIS — Z1321 Encounter for screening for nutritional disorder: Secondary | ICD-10-CM

## 2020-03-27 DIAGNOSIS — Z125 Encounter for screening for malignant neoplasm of prostate: Secondary | ICD-10-CM

## 2020-03-27 DIAGNOSIS — Z13220 Encounter for screening for lipoid disorders: Secondary | ICD-10-CM

## 2020-03-27 LAB — POCT GLYCOSYLATED HEMOGLOBIN (HGB A1C): Hemoglobin A1C: 6.8 % — AB (ref 4.0–5.6)

## 2020-03-27 NOTE — Progress Notes (Signed)
Established Patient Office Visit  Subjective:  Patient ID: Raymond Lewis, male    DOB: [birth year]  Age: 60 y.o. MRN: [id number]  CC:  Chief Complaint  Patient presents with  . Establish Care    HPI Raymond Lewis presents for establish care. He  has a past medical history of COVID-19.   He was diagnosed with Covid on 02/04/2020 until 02/15/2020.  He was diagnosed with Covid pneumonia with acute respiratory failure with hypoxia, acute kidney injury and hyperglycemia.  He admits that he was treated with steroids which required insulin for his elevated blood glucose.  He initially was requiring 15 L of humidified oxygen via nasal cannula.  He was discharged on 4 L of oxygen.  He admits that he no longer using oxygen.  His most recent chest x-ray does show infiltrates.  He is currently not taking any medication.  He was taking Metformin 500 mg for a few days post discharge.  He never increased to Metformin 1000 mg as directed.  He is adamant that he does not have problems with his blood glucose.  Marland Kitchen He is feels so much better now. He does not feel any, shortness of breath, chest pain, chest tightness, wheezing, any signs of respiratory distress.  Diabetes Mellitus Patient presents for follow up of diabetes. Current symptoms include: none. Symptoms have stabilized. Patient denies foot ulcerations, hyperglycemia, hypoglycemia , increased appetite, nausea, paresthesia of the feet and polydipsia. Evaluation to date has included: fasting blood sugar, fasting lipid panel and hemoglobin A1C.  Home sugars: patient does not check sugars. Current treatment: more intensive attention to diet which has been somewhat effective.   He does lemons and water. He uses holistic care. He juices. He admits that he had stopped this for a while. He does not use supplements.   Past Medical History:  Diagnosis Date  . COVID-19     History reviewed. No pertinent surgical history.  Family History  Problem Relation Age of  Onset  . Cancer Mother   . Hypertension Father   . Cancer Father     Social History   Socioeconomic History  . Marital status: Married    Spouse name: Not on file  . Number of children: Not on file  . Years of education: Not on file  . Highest education level: Not on file  Occupational History  . Not on file  Tobacco Use  . Smoking status: Never Smoker  . Smokeless tobacco: Never Used  Substance and Sexual Activity  . Alcohol use: Not Currently  . Drug use: Never  . Sexual activity: Yes  Other Topics Concern  . Not on file  Social History Narrative  . Not on file   Social Determinants of Health   Financial Resource Strain: Not on file  Food Insecurity: Not on file  Transportation Needs: Not on file  Physical Activity: Not on file  Stress: Not on file  Social Connections: Not on file  Intimate Partner Violence: Not on file    No outpatient medications prior to visit.   No facility-administered medications prior to visit.    No Known Allergies  ROS Review of Systems  Respiratory: Negative for chest tightness and wheezing.        Improving       Objective:    Physical Exam Constitutional:      General: He is not in acute distress.    Appearance: He is normal weight. He is not ill-appearing, toxic-appearing or diaphoretic.  HENT:  Head: Normocephalic and atraumatic.     Right Ear: There is no impacted cerumen.     Left Ear: There is impacted cerumen.     Nose: Nose normal.     Mouth/Throat:     Mouth: Mucous membranes are moist.  Cardiovascular:     Rate and Rhythm: Normal rate and regular rhythm.     Pulses: Normal pulses.     Heart sounds: Normal heart sounds.  Pulmonary:     Effort: Pulmonary effort is normal.     Breath sounds: Normal breath sounds.  Abdominal:     General: Bowel sounds are normal.     Palpations: Abdomen is soft.     Comments: Hypoactive  Musculoskeletal:        General: Normal range of motion.     Cervical back:  Normal range of motion.  Skin:    General: Skin is warm and dry.     Capillary Refill: Capillary refill takes less than 2 seconds.  Neurological:     General: No focal deficit present.     Mental Status: He is alert and oriented to person, place, and time.  Psychiatric:        Mood and Affect: Mood normal.        Behavior: Behavior normal.        Thought Content: Thought content normal.        Judgment: Judgment normal.     BP 130/83   Pulse 76   Ht 6' 1.5" (1.867 m)   Wt 200 lb (90.7 kg)   BMI 26.03 kg/m  Wt Readings from Last 3 Encounters:  03/27/20 200 lb (90.7 kg)  03/26/20 197 lb (89.4 kg)  03/05/20 198 lb (89.8 kg)     There are no preventive care reminders to display for this patient.  There are no preventive care reminders to display for this patient.  No results found for: TSH Lab Results  Component Value Date   WBC 4.9 03/05/2020   HGB 14.1 03/05/2020   HCT 40.6 03/05/2020   MCV 89 03/05/2020   PLT 221 03/05/2020   Lab Results  Component Value Date   NA 138 03/05/2020   K 4.4 03/05/2020   CO2 21 03/05/2020   GLUCOSE 187 (H) 03/05/2020   BUN 5 (L) 03/05/2020   CREATININE 0.71 (L) 03/05/2020   BILITOT 1.0 03/05/2020   ALKPHOS 90 03/05/2020   AST 14 03/05/2020   ALT 20 03/05/2020   PROT 7.0 03/05/2020   ALBUMIN 3.8 03/05/2020   CALCIUM 9.5 03/05/2020   ANIONGAP 12 02/15/2020   No results found for: CHOL No results found for: HDL No results found for: [facility] Lab Results  Component Value Date   TRIG 88 02/04/2020   No results found for: CHOLHDL Lab Results  Component Value Date   HGBA1C 6.8 (A) 03/27/2020      Assessment & Plan:   Problem List Items Addressed This Visit   None   Visit Diagnoses    Elevated glucose    Current A1c 6.8 patient to perform lifestyle modifications over the next 3 months if A1c remains greater than 6.4% has agreed to treat diabetes per recommendation.   Relevant Orders   POCT glycosylated hemoglobin (Hb  A1C) (Completed)   Comp. Metabolic Panel (12)   Microalbumin, urine   Personal history of COVID-19     Encouraged COVID-19 vaccination patient, family and friends  Discussed precautionary measures due to the risk of reinfection; wearing proper fitting mask,  avoiding handling the mask especially the outside without proper hand hygiene each time, washing hands often when coming in contact with known and unknown surfaces, using sanitizer when handwashing stations are not available, social distancing, and encouraging family members to do the same.  Most importantly when you are sick and in close contact with others, all are considered exposed and should remain in quarantine for 5 to 14 days days. It is a case by case bases for quarantining.   If you have questions please contact our office via phone or MyChart.      Relevant Orders   CBC with Differential/Platelet   Encounter for hepatitis C screening test for low risk patient       Relevant Orders   Hepatitis C antibody   Screening for HIV (human immunodeficiency virus)       Screening for cholesterol level       Relevant Orders   Lipid panel   Encounter to establish care     Discussed male health maintenance; self exams of breast and testicles  and annual exams. Discussed prostate age related concerns Discussed general safety in vehicle and COVID Discussed regular hydration with water Discussed healthy diet and exercise and weight management Discussed sexual health  Discussed mental health Encouraged to call our office for an appointment with in ongoing concerns for questions.         No orders of the defined types were placed in this encounter.   Follow-up: Return in about 3 months (around 06/24/2020).    Barbette Merino, NP

## 2020-03-27 NOTE — Patient Instructions (Addendum)

## 2020-03-27 NOTE — Assessment & Plan Note (Signed)
Cough:   Stay well hydrated  Stay active  Deep breathing exercises  May take tylenol or fever or pain  May take mucinex twice daily  Will place referral to pulmonary   Follow up:  Follow up in 4 weeks or sooner if needed

## 2020-03-27 NOTE — Progress Notes (Signed)
0900

## 2020-03-27 NOTE — Progress Notes (Signed)
Patient no-showed today's appointment; provider notified for review of record.

## 2020-03-28 ENCOUNTER — Telehealth: Payer: Self-pay

## 2020-03-28 LAB — CBC WITH DIFFERENTIAL/PLATELET
Basophils Absolute: 0 10*3/uL (ref 0.0–0.2)
Basos: 1 %
EOS (ABSOLUTE): 0.1 10*3/uL (ref 0.0–0.4)
Eos: 2 %
Hematocrit: 44.8 % (ref 37.5–51.0)
Hemoglobin: 15.3 g/dL (ref 13.0–17.7)
Immature Grans (Abs): 0 10*3/uL (ref 0.0–0.1)
Immature Granulocytes: 0 %
Lymphocytes Absolute: 1.1 10*3/uL (ref 0.7–3.1)
Lymphs: 24 %
MCH: 30.7 pg (ref 26.6–33.0)
MCHC: 34.2 g/dL (ref 31.5–35.7)
MCV: 90 fL (ref 79–97)
Monocytes Absolute: 0.5 10*3/uL (ref 0.1–0.9)
Monocytes: 12 %
Neutrophils Absolute: 2.6 10*3/uL (ref 1.4–7.0)
Neutrophils: 61 %
Platelets: 167 10*3/uL (ref 150–450)
RBC: 4.99 x10E6/uL (ref 4.14–5.80)
RDW: 13.2 % (ref 11.6–15.4)
WBC: 4.3 10*3/uL (ref 3.4–10.8)

## 2020-03-28 LAB — LIPID PANEL
Chol/HDL Ratio: 5.4 ratio — ABNORMAL HIGH (ref 0.0–5.0)
Cholesterol, Total: 200 mg/dL — ABNORMAL HIGH (ref 100–199)
HDL: 37 mg/dL — ABNORMAL LOW (ref >39)
LDL Chol Calc (NIH): 135 mg/dL — ABNORMAL HIGH (ref 0–99)
Triglycerides: 154 mg/dL — ABNORMAL HIGH (ref 0–149)
VLDL Cholesterol Cal: 28 mg/dL (ref 5–40)

## 2020-03-28 LAB — COMP. METABOLIC PANEL (12)
AST: 12 IU/L (ref 0–40)
Albumin/Globulin Ratio: 1.4 (ref 1.2–2.2)
Albumin: 4.3 g/dL (ref 3.8–4.9)
Alkaline Phosphatase: 97 IU/L (ref 44–121)
BUN/Creatinine Ratio: 7 — ABNORMAL LOW (ref 9–20)
BUN: 6 mg/dL (ref 6–24)
Bilirubin Total: 1.4 mg/dL — ABNORMAL HIGH (ref 0.0–1.2)
Calcium: 9.7 mg/dL (ref 8.7–10.2)
Chloride: 100 mmol/L (ref 96–106)
Creatinine, Ser: 0.84 mg/dL (ref 0.76–1.27)
GFR calc Af Amer: 112 mL/min/{1.73_m2} (ref >59)
GFR calc non Af Amer: 97 mL/min/{1.73_m2} (ref >59)
Globulin, Total: 3.1 g/dL (ref 1.5–4.5)
Glucose: 142 mg/dL — ABNORMAL HIGH (ref 65–99)
Potassium: 4.4 mmol/L (ref 3.5–5.2)
Sodium: 134 mmol/L (ref 134–144)
Total Protein: 7.4 g/dL (ref 6.0–8.5)

## 2020-03-28 LAB — MICROALBUMIN, URINE: Microalbumin, Urine: 6.3 ug/mL

## 2020-03-28 LAB — HEPATITIS C ANTIBODY: Hep C Virus Ab: <0.1 s/co ratio (ref 0.0–0.9)

## 2020-03-28 NOTE — Telephone Encounter (Signed)
Pt states wants the Steroid medicine and cough medicine to be called in the pharmacy

## 2020-03-31 ENCOUNTER — Other Ambulatory Visit: Payer: Self-pay | Admitting: Nurse Practitioner

## 2020-03-31 MED ORDER — PREDNISONE 20 MG PO TABS: 20.0000 mg | ORAL_TABLET | Freq: Every day | ORAL | 0 refills | Status: AC

## 2020-03-31 MED ORDER — BENZONATATE 100 MG PO CAPS: 100.0000 mg | ORAL_CAPSULE | Freq: Three times a day (TID) | ORAL | 0 refills | Status: AC | PRN

## 2020-03-31 NOTE — Telephone Encounter (Signed)
OK - thanks

## 2020-03-31 NOTE — Telephone Encounter (Signed)
Yes please. This is what was asked. Of course we were not here on Friday. You may want to reach out to him.

## 2020-03-31 NOTE — Telephone Encounter (Signed)
Patient was offered those medications during visit, but refused those medications. He stated that he did not like to take a lot of medications. Does he want the medications now?

## 2020-03-31 NOTE — Telephone Encounter (Signed)
Were you going to provide him with cough medication and steroid? He was doing well at my visit and this was not discussed.

## 2020-04-03 NOTE — Progress Notes (Signed)
Lab results mailed to patient.

## 2020-04-08 ENCOUNTER — Ambulatory Visit: Payer: No Typology Code available for payment source

## 2020-04-16 ENCOUNTER — Ambulatory Visit: Payer: No Typology Code available for payment source

## 2020-04-23 LAB — SPECIMEN STATUS REPORT

## 2020-04-23 LAB — PSA: Prostate Specific Ag, Serum: 0.3 ng/mL (ref 0.0–4.0)

## 2020-04-23 LAB — VITAMIN D 25 HYDROXY (VIT D DEFICIENCY, FRACTURES): Vit D, 25-Hydroxy: 15 ng/mL — ABNORMAL LOW (ref 30.0–100.0)

## 2020-06-25 ENCOUNTER — Ambulatory Visit: Payer: Self-pay | Admitting: Nurse Practitioner

## 2020-09-03 ENCOUNTER — Ambulatory Visit: Payer: Self-pay | Admitting: Nurse Practitioner

## 2021-01-05 ENCOUNTER — Encounter (HOSPITAL_BASED_OUTPATIENT_CLINIC_OR_DEPARTMENT_OTHER): Payer: Self-pay

## 2021-01-05 ENCOUNTER — Other Ambulatory Visit: Payer: Self-pay

## 2021-01-05 DIAGNOSIS — R221 Localized swelling, mass and lump, neck: Secondary | ICD-10-CM | POA: Insufficient documentation

## 2021-01-05 DIAGNOSIS — Z8616 Personal history of COVID-19: Secondary | ICD-10-CM | POA: Insufficient documentation

## 2021-01-05 NOTE — ED Triage Notes (Signed)
Pt arrives POV with c/o of a lump to the right anterior cervical region for approximately 1 week.  Denies any pain in this area.  Reports some pain in the right posterior cervical region and right shoulder.  Reports pain and burning in bilateral feet for about 1 year.

## 2021-01-06 ENCOUNTER — Emergency Department (HOSPITAL_BASED_OUTPATIENT_CLINIC_OR_DEPARTMENT_OTHER): Payer: No Typology Code available for payment source

## 2021-01-06 ENCOUNTER — Emergency Department (HOSPITAL_BASED_OUTPATIENT_CLINIC_OR_DEPARTMENT_OTHER)
Admission: EM | Admit: 2021-01-06 | Discharge: 2021-01-06 | Disposition: A | Discharge status: Home / Self Care | Payer: No Typology Code available for payment source | Attending: Emergency Medicine | Admitting: Emergency Medicine

## 2021-01-06 ENCOUNTER — Encounter (HOSPITAL_BASED_OUTPATIENT_CLINIC_OR_DEPARTMENT_OTHER): Payer: Self-pay

## 2021-01-06 DIAGNOSIS — R221 Localized swelling, mass and lump, neck: Secondary | ICD-10-CM

## 2021-01-06 HISTORY — DX: Pneumonia, unspecified organism: J18.9

## 2021-01-06 LAB — BASIC METABOLIC PANEL
Anion gap: 9 (ref 5–15)
BUN: 8 mg/dL (ref 6–20)
CO2: 24 mmol/L (ref 22–32)
Calcium: 9.7 mg/dL (ref 8.9–10.3)
Chloride: 100 mmol/L (ref 98–111)
Creatinine, Ser: 0.63 mg/dL (ref 0.61–1.24)
GFR, Estimated: >60 mL/min (ref >60)
Glucose, Bld: 202 mg/dL — ABNORMAL HIGH (ref 70–99)
Potassium: 4.1 mmol/L (ref 3.5–5.1)
Sodium: 133 mmol/L — ABNORMAL LOW (ref 135–145)

## 2021-01-06 LAB — CBC
HCT: 46.9 % (ref 39.0–52.0)
Hemoglobin: 16.7 g/dL (ref 13.0–17.0)
MCH: 31 pg (ref 26.0–34.0)
MCHC: 35.6 g/dL (ref 30.0–36.0)
MCV: 87.2 fL (ref 80.0–100.0)
Platelets: 168 10*3/uL (ref 150–400)
RBC: 5.38 MIL/uL (ref 4.22–5.81)
RDW: 12.5 % (ref 11.5–15.5)
WBC: 5 10*3/uL (ref 4.0–10.5)
nRBC: 0 % (ref 0.0–0.2)

## 2021-01-06 MED ORDER — IOHEXOL 300 MG/ML  SOLN
80.0000 mL | Freq: Once | INTRAMUSCULAR | Status: AC | PRN
  Administered 2021-01-06: 80 mL via INTRAVENOUS

## 2021-01-06 NOTE — Discharge Instructions (Signed)
You were evaluated in the Emergency Department and after careful evaluation, we did not find any emergent condition requiring admission or further testing in the hospital.  Your exam/testing today was overall reassuring.  Symptoms seem to be due to an inflamed or enlarged joint between your collarbone and sternum.  Not an emergency or dangerous, recommend Tylenol or Motrin for discomfort.  Please return to the Emergency Department if you experience any worsening of your condition.  Thank you for allowing [place] to be a part of your care.

## 2021-01-06 NOTE — ED Provider Notes (Signed)
[facility] EMERGENCY [facility] Emergency Department Provider Note MRN:  [id number]  Arrival date & time: 01/06/21     Chief Complaint   Mass   History of Present Illness   Raymond Lewis is a 60 y.o. year-old male with no pertinent past medical presenting to the ED with chief complaint of mass.  Patient noticed a mass to the right anterior neck about 3 days ago.  A bit tender to the touch.  Has been having some night sweats for the past week or so.  Denies unintentional weight loss.  No other symptoms at this time.  Symptoms mild, constant, no other exacerbating or alleviating factors.  Review of Systems  A complete 10 system review of systems was obtained and all systems are negative except as noted in the HPI and PMH.   Patient's Health History    Past Medical History:  Diagnosis Date   COVID-19    Pneumonia     History reviewed. No pertinent surgical history.  Family History  Problem Relation Age of Onset   Cancer Mother    Hypertension Father    Cancer Father     Social History   Socioeconomic History   Marital status: Married    Spouse name: Not on file   Number of children: Not on file   Years of education: Not on file   Highest education level: Not on file  Occupational History   Not on file  Tobacco Use   Smoking status: Never   Smokeless tobacco: Never  Substance and Sexual Activity   Alcohol use: Not Currently   Drug use: Never   Sexual activity: Yes  Other Topics Concern   Not on file  Social History Narrative   Not on file   Social Determinants of Health   Financial Resource Strain: Not on file  Food Insecurity: Not on file  Transportation Needs: Not on file  Physical Activity: Not on file  Stress: Not on file  Social Connections: Not on file  Intimate Partner Violence: Not on file     Physical Exam   Vitals:   01/05/21 2202 01/06/21 0200  BP: (!) 173/98 (!) 142/91  Pulse: 84 61  Resp: 16 16  Temp: 98.2 F (36.8 C) 98.1 F  (36.7 C)  SpO2: 100% 100%    CONSTITUTIONAL: Well-appearing, NAD NEURO:  Alert and oriented x 3, no focal deficits EYES:  eyes equal and reactive ENT/NECK:  no LAD, no JVD CARDIO: Regular rate, well-perfused, normal S1 and S2 PULM:  CTAB no wheezing or rhonchi GI/GU:  normal bowel sounds, non-distended, non-tender MSK/SPINE:  No gross deformities, no edema SKIN:  no rash, atraumatic; fullness to the right jugular notch with mild tenderness PSYCH:  Appropriate speech and behavior  *Additional and/or pertinent findings included in MDM below  Diagnostic and Interventional Summary    EKG Interpretation  Date/Time:    Ventricular Rate:    PR Interval:    QRS Duration:   QT Interval:    QTC Calculation:   R Axis:     Text Interpretation:         Labs Reviewed  BASIC METABOLIC PANEL - Abnormal; Notable for the following components:      Result Value   Sodium 133 (*)    Glucose, Bld 202 (*)    All other components within normal limits  CBC    CT Soft Tissue Neck W Contrast  Final Result      Medications  iohexol (OMNIPAQUE) 300 MG/ML  solution 80 mL (80 mLs Intravenous Contrast Given 01/06/21 0249)     Procedures  /  Critical Care Procedures  ED Course and Medical Decision Making  I have reviewed the triage vital signs, the nursing notes, and pertinent available records from the EMR.  Listed above are laboratory and imaging tests that I personally ordered, reviewed, and interpreted and then considered in my medical decision making (see below for details).  CT to evaluate for lymphadenopathy or signs of Hodgkin's lymphoma given the night sweats.     CT reassuring, appropriate for discharge.  Elmer Sow. Pilar Plate, MD [facility] Emergency Medicine [facility] [email or web address]  Final Clinical Impressions(s) / ED Diagnoses     ICD-10-CM   1. Neck mass  R22.1       ED Discharge Orders     None        Discharge Instructions Discussed with  and Provided to Patient:     Discharge Instructions      You were evaluated in the Emergency Department and after careful evaluation, we did not find any emergent condition requiring admission or further testing in the hospital.  Your exam/testing today was overall reassuring.  Symptoms seem to be due to an inflamed or enlarged joint between your collarbone and sternum.  Not an emergency or dangerous, recommend Tylenol or Motrin for discomfort.  Please return to the Emergency Department if you experience any worsening of your condition.  Thank you for allowing [place] to be a part of your care.         Sabas Sous, MD 01/06/21 0330

## 2021-05-05 ENCOUNTER — Emergency Department (HOSPITAL_BASED_OUTPATIENT_CLINIC_OR_DEPARTMENT_OTHER)
Admission: EM | Admit: 2021-05-05 | Discharge: 2021-05-05 | Disposition: A | Discharge status: Home / Self Care | Payer: No Typology Code available for payment source | Attending: Emergency Medicine | Admitting: Emergency Medicine

## 2021-05-05 ENCOUNTER — Emergency Department (HOSPITAL_BASED_OUTPATIENT_CLINIC_OR_DEPARTMENT_OTHER): Payer: No Typology Code available for payment source

## 2021-05-05 ENCOUNTER — Other Ambulatory Visit: Payer: Self-pay

## 2021-05-05 ENCOUNTER — Encounter (HOSPITAL_BASED_OUTPATIENT_CLINIC_OR_DEPARTMENT_OTHER): Payer: Self-pay | Admitting: Emergency Medicine

## 2021-05-05 DIAGNOSIS — Y990 Civilian activity done for income or pay: Secondary | ICD-10-CM | POA: Insufficient documentation

## 2021-05-05 DIAGNOSIS — M546 Pain in thoracic spine: Secondary | ICD-10-CM | POA: Insufficient documentation

## 2021-05-05 DIAGNOSIS — X500XXA Overexertion from strenuous movement or load, initial encounter: Secondary | ICD-10-CM | POA: Insufficient documentation

## 2021-05-05 DIAGNOSIS — M542 Cervicalgia: Secondary | ICD-10-CM | POA: Insufficient documentation

## 2021-05-05 MED ORDER — PREDNISONE 10 MG PO TABS: 20.0000 mg | ORAL_TABLET | Freq: Two times a day (BID) | ORAL | 0 refills | Status: DC

## 2021-05-05 MED ORDER — OXYCODONE-ACETAMINOPHEN 5-325 MG PO TABS
2.0000 | ORAL_TABLET | Freq: Once | ORAL | Status: AC
  Administered 2021-05-05: 2 via ORAL
  Filled 2021-05-05: qty 2

## 2021-05-05 MED ORDER — METHOCARBAMOL 500 MG PO TABS: 500.0000 mg | ORAL_TABLET | Freq: Three times a day (TID) | ORAL | 0 refills | Status: DC | PRN

## 2021-05-05 MED ORDER — KETOROLAC TROMETHAMINE 60 MG/2ML IM SOLN
60.0000 mg | Freq: Once | INTRAMUSCULAR | Status: AC
Start: 2021-05-05 — End: 2021-05-05
  Administered 2021-05-05: 60 mg via INTRAMUSCULAR
  Filled 2021-05-05: qty 2

## 2021-05-05 NOTE — ED Provider Notes (Signed)
?[facility] EMERGENCY DEPARTMENT ?Provider Note ? ? ?CSN: [id number] ?Arrival date & time: 05/05/21  0049 ? ?  ? ?History ? ?Chief Complaint  ?Patient presents with  ? Back Pain  ? ? ?Raymond Lewis is a 61 y.o. male. ? ?Patient is a 61 year old male presenting with complaints of pain in his upper back.  This has been present for the past 5 days.  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  He denies any numbness or tingling of his arms.  He denies any chest pain or difficulty breathing.  The pain in his upper back radiates into his neck and is worse when he turns his head.  He has been taking Tylenol and his wife's hydrocodone with little relief. ? ?The history is provided by the patient.  ?Back Pain ?Location:  Thoracic spine ?Quality:  Aching ?Radiates to: The back of the neck. ?Pain severity:  Moderate ?Onset quality:  Gradual ?Duration:  5 days ?Timing:  Constant ?Progression:  Worsening ?Chronicity:  New ? ?  ? ?Home Medications ?Prior to Admission medications   ?Medication Sig Start Date End Date Taking? Authorizing Provider  ?metFORMIN (GLUCOPHAGE-XR) 500 MG 24 hr tablet TAKE 1 TABLET (500 MG TOTAL) BY MOUTH DAILY WITH BREAKFAST FOR 7 DAYS, THEN 2 TABLETS (1,000 MG TOTAL) DAILY WITH BREAKFAST FOR 7 DAYS. ?Patient not taking: Reported on 01/05/2021 02/15/20 02/14/21  Roylene Reason, MD  ?   ? ?Allergies    ?Patient has no known allergies.   ? ?Review of Systems   ?Review of Systems  ?Musculoskeletal:  Positive for back pain.  ?All other systems reviewed and are negative. ? ?Physical Exam ?Updated Vital Signs ?BP (!) 145/94 (BP Location: Right Arm)   Pulse 80   Temp 98.2 ?F (36.8 ?C) (Oral)   Resp 18   Ht 6\' 2"  (1.88 m)   Wt 93 kg   SpO2 98%   BMI 26.32 kg/m?  ?Physical Exam ?Vitals and nursing note reviewed.  ?Constitutional:   ?   General: He is not in acute distress. ?   Appearance: He is  well-developed. He is not diaphoretic.  ?HENT:  ?   Head: Normocephalic and atraumatic.  ?Neck:  ?   Comments: Patient does have discomfort with turning his head to the left and right. ?Cardiovascular:  ?   Rate and Rhythm: Normal rate and regular rhythm.  ?   Heart sounds: No murmur heard. ?  No friction rub.  ?Pulmonary:  ?   Effort: Pulmonary effort is normal. No respiratory distress.  ?   Breath sounds: Normal breath sounds. No wheezing or rales.  ?Abdominal:  ?   General: Bowel sounds are normal. There is no distension.  ?   Palpations: Abdomen is soft.  ?   Tenderness: There is no abdominal tenderness.  ?Musculoskeletal:     ?   General: Normal range of motion.  ?   Cervical back: No rigidity.  ?   Comments: There is tenderness to palpation in the soft tissues of the thoracic region between the shoulder blades.  There is no palpable abnormality.  ?Lymphadenopathy:  ?   Cervical: No cervical adenopathy.  ?Skin: ?   General: Skin is warm and dry.  ?Neurological:  ?   Mental Status: He is alert and oriented to person, place, and time.  ?   Coordination: Coordination normal.  ? ? ?ED Results /  Procedures / Treatments   ?Labs ?(all labs ordered are listed, but only abnormal results are displayed) ?Labs Reviewed - No data to display ? ?EKG ?None ? ?Radiology ?No results found. ? ?Procedures ?Procedures  ? ? ?Medications Ordered in ED ?Medications  ?ketorolac (TORADOL) injection 60 mg (has no administration in time range)  ?oxyCODONE-acetaminophen (PERCOCET/ROXICET) 5-325 MG per tablet 2 tablet (has no administration in time range)  ? ? ?ED Course/ Medical Decision Making/ A&P ? ?Patient presenting here with complaints of pain in his upper back radiating into his neck.  He has worsening pain when he attempts to bend, twist, or turn his head.  Symptoms are most likely musculoskeletal in nature.  Chest x-ray was obtained showing no abnormality.  Patient will be treated with Toradol and oxycodone here in the ER.  I feel  as though he can be discharged with prednisone and Robaxin.  He is to follow-up with primary doctor if not improving. ? ?Final Clinical Impression(s) / ED Diagnoses ?Final diagnoses:  ?None  ? ? ?Rx / DC Orders ?ED Discharge Orders   ? ? None  ? ?  ? ? ?  ?Geoffery Lyons, MD ?05/05/21 0225 ? ?

## 2021-05-05 NOTE — ED Triage Notes (Signed)
Mid back pain travels up neck and both sides of head. X 4 days denies injury. But does lifting at work.  ?

## 2021-05-05 NOTE — Discharge Instructions (Signed)
Begin taking prednisone as prescribed. ? ?Begin taking Robaxin as prescribed as needed for pain. ? ?Follow-up with primary doctor if not improving in the next week.   ?

## 2021-05-20 ENCOUNTER — Ambulatory Visit: Payer: Self-pay | Admitting: Nurse Practitioner

## 2021-05-22 ENCOUNTER — Ambulatory Visit: Payer: Self-pay | Admitting: Nurse Practitioner

## 2021-05-27 ENCOUNTER — Ambulatory Visit: Payer: Self-pay | Admitting: Nurse Practitioner

## 2021-09-21 IMAGING — DX DG CHEST 1V
1 series · 1 of 1 positions shown · non-contrast
Comparison: February 04, 2020

CLINICAL DATA: [id number] positive with recent pneumonia

EXAM:
CHEST  1 VIEW

[chest ap]
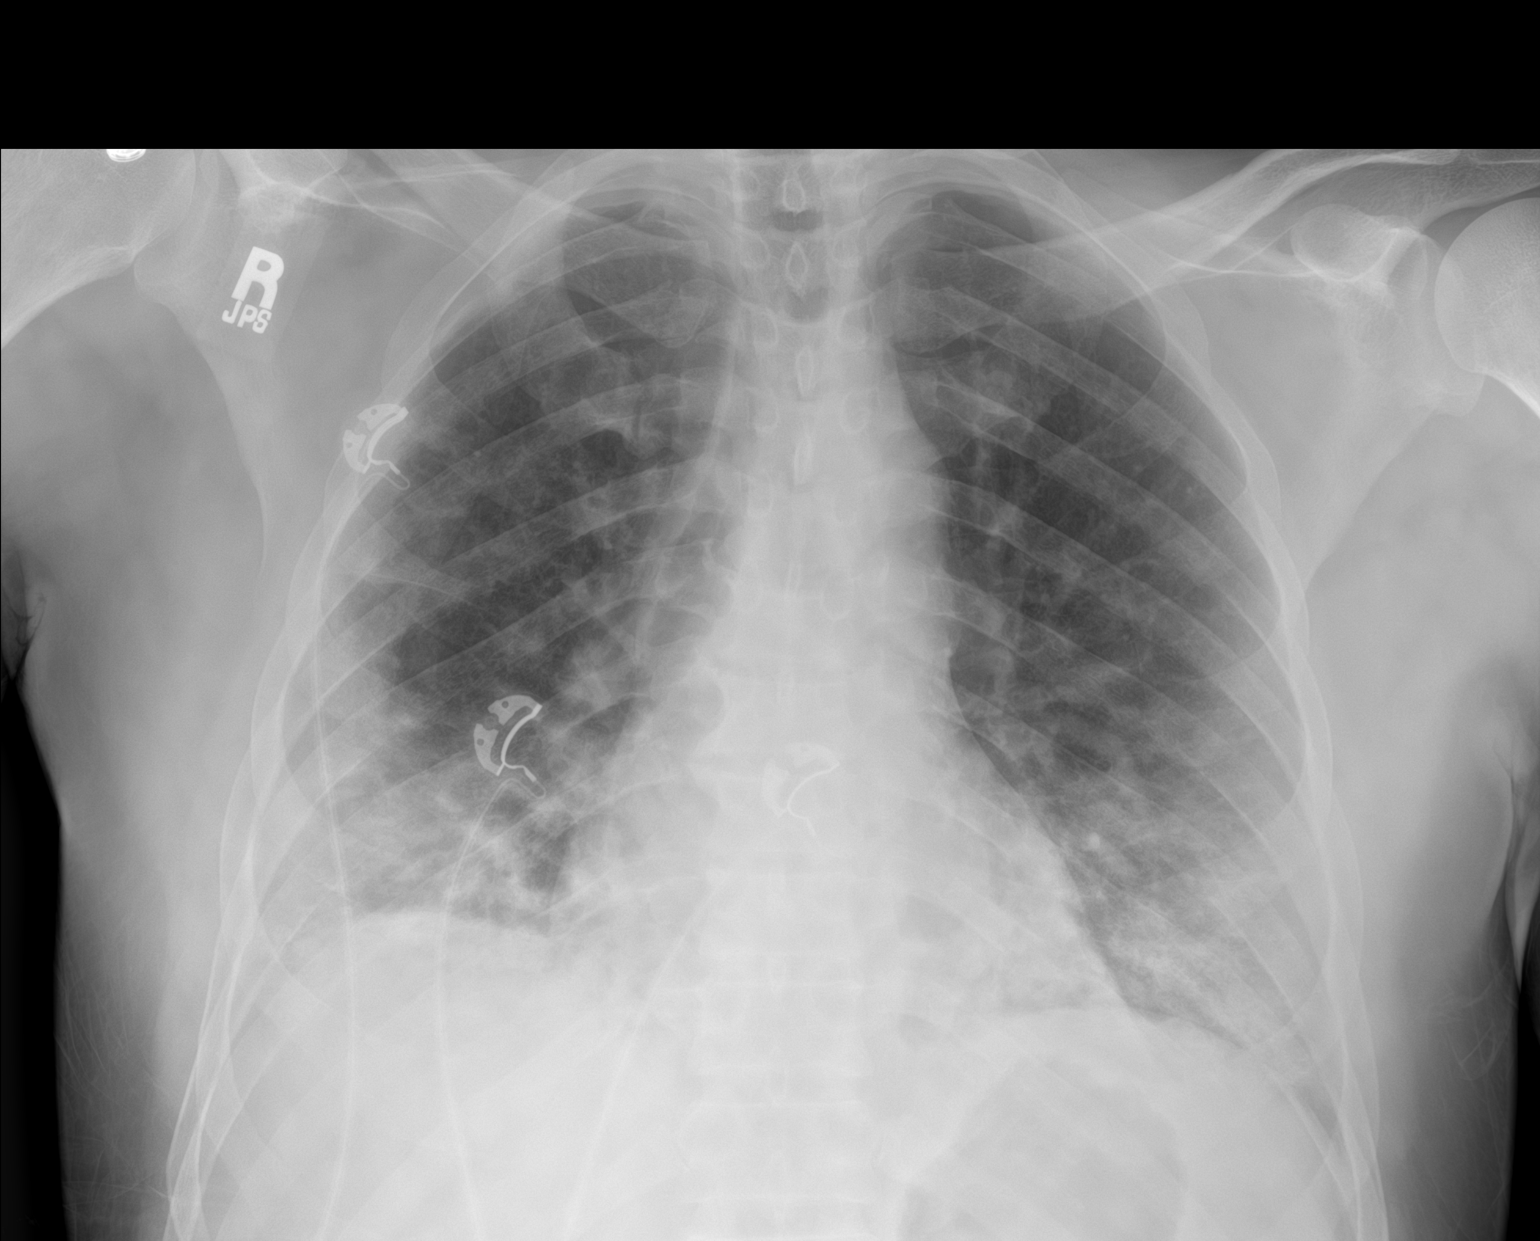

[1 of 1 positions shown; findings below may reference images not displayed]

FINDINGS: There is airspace opacity in each lower lung region, increased from
most recent study. Upper lung regions are clear. Heart size and
pulmonary vascularity are normal. No adenopathy. No bone lesions.
IMPRESSION: Increase in bibasilar airspace opacity consistent with multifocal
pneumonia. Suspect atypical organism pneumonia, particularly given
the clinical history.

Heart size normal.  No evident adenopathy.

## 2022-08-17 IMAGING — CT CT NECK W/ CM
5 series · 16 of 33 positions shown, 18 images · IV contrast (APPLIED)
Comparison: None.

CLINICAL DATA: Neck mass

EXAM:
CT NECK WITH CONTRAST
TECHNIQUE: Multidetector CT imaging of the neck was performed using the
standard protocol following the bolus administration of intravenous
contrast.
CONTRAST:  80mL OMNIPAQUE IOHEXOL 300 MG/ML  SOLN

[Series 2: axial neck (person_name) · axial · 0.50mm/px · z∈[-166,-68]mm · 2 of 147 slices shown]
[im 49/147  bone]
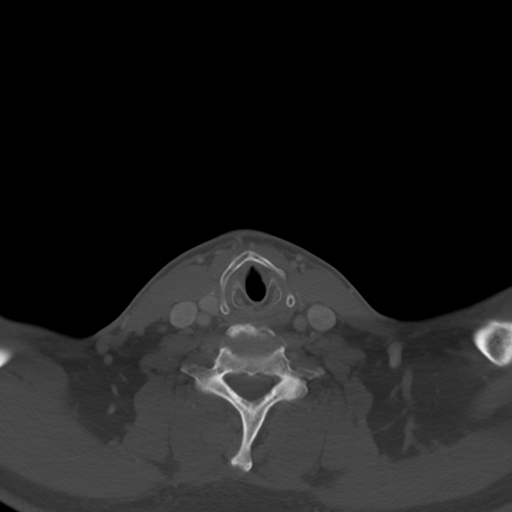
[im 98/147  bone]
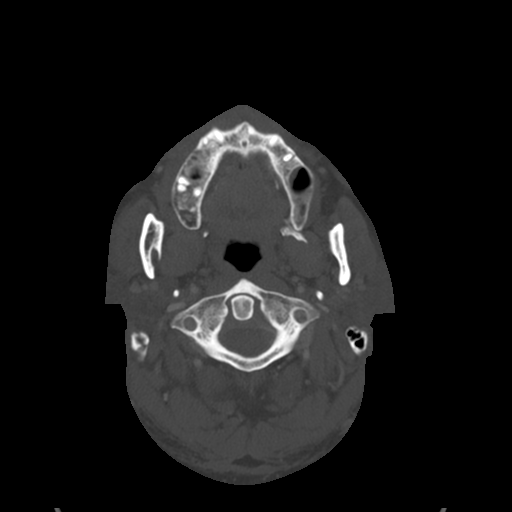

[Series 3: ax bone · axial · 0.50mm/px · z∈[-190,-44]mm · 3 of 147 slices shown, 4 images]
[im 37/147  soft-tissue]
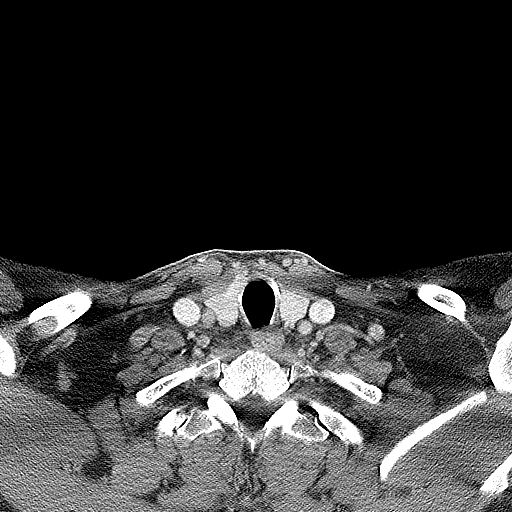
[im 37/147  bone]
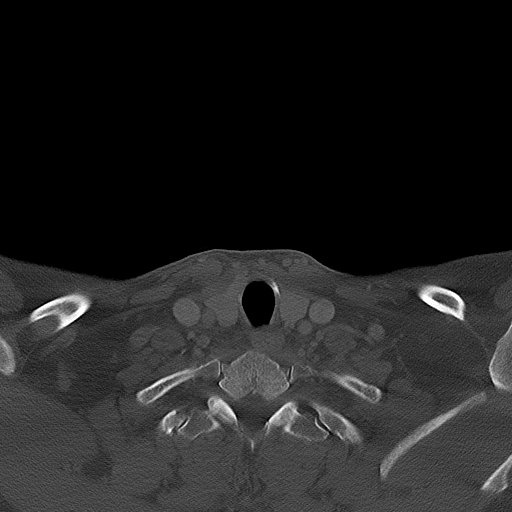
[im 74/147  bone]
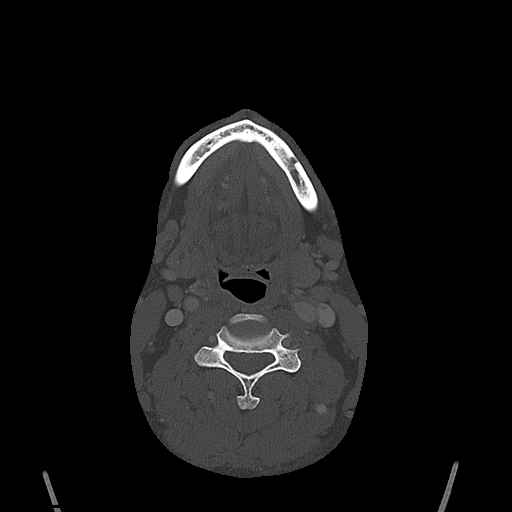
[im 110/147  bone]
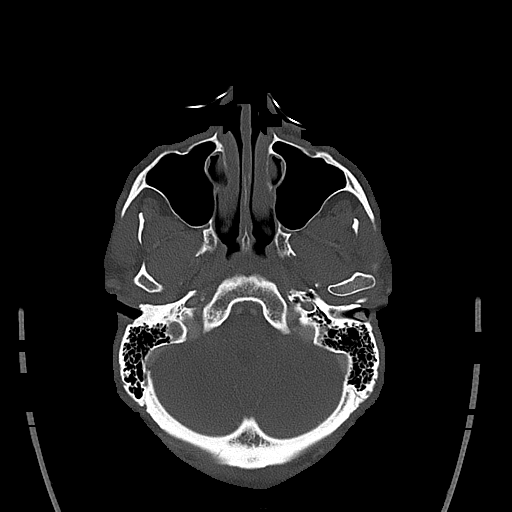

[Series 4: cor neck · coronal · 0.51mm/px · 3 of 124 slices shown]
[im 25/124  bone]
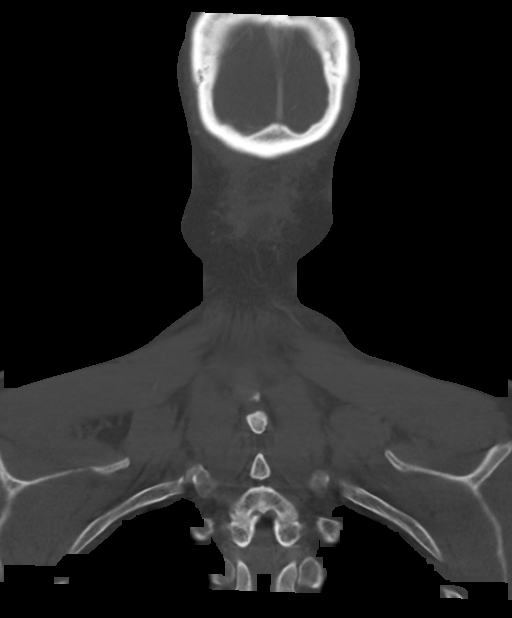
[im 50/124  bone]
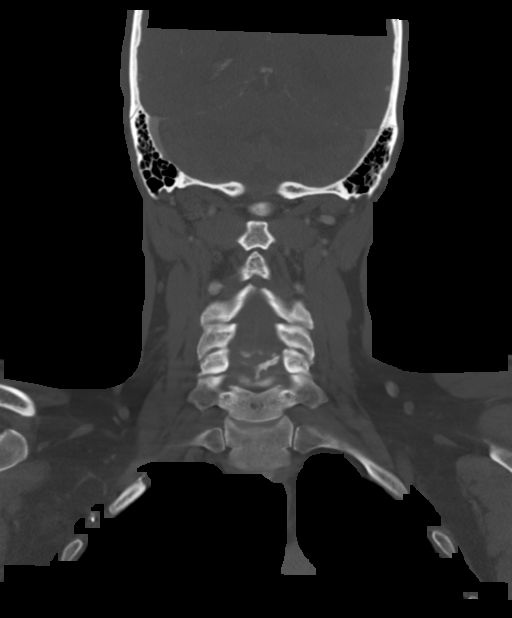
[im 74/124  bone]
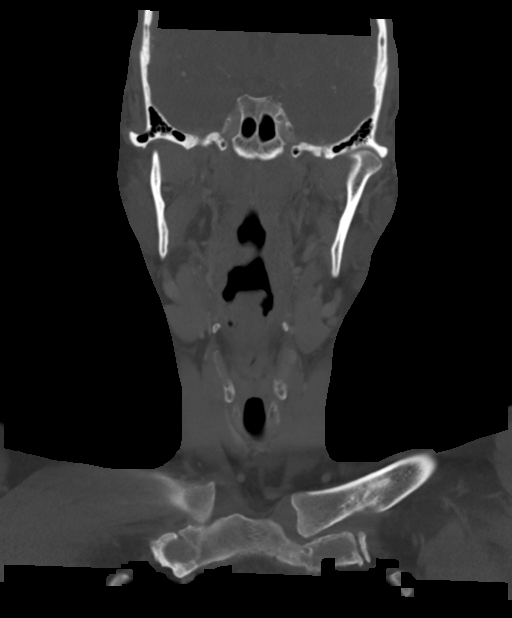

[Series 5: sag neck · sagittal · 0.58mm/px · 5 of 113 slices shown, 6 images]
[im 38/113  bone]
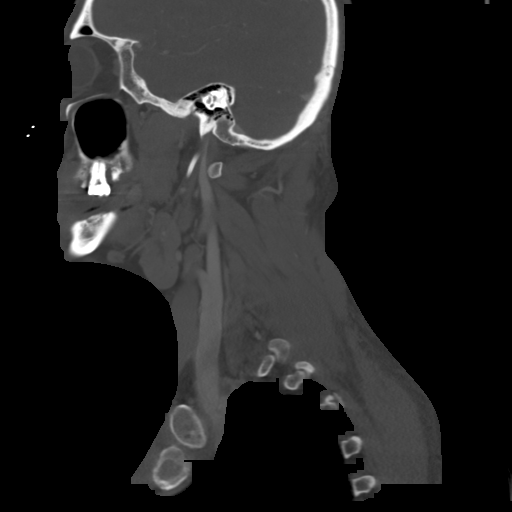
[im 47/113  bone]
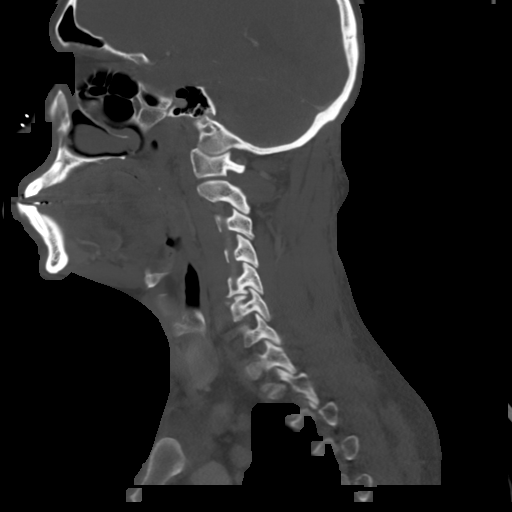
[im 57/113  soft-tissue]
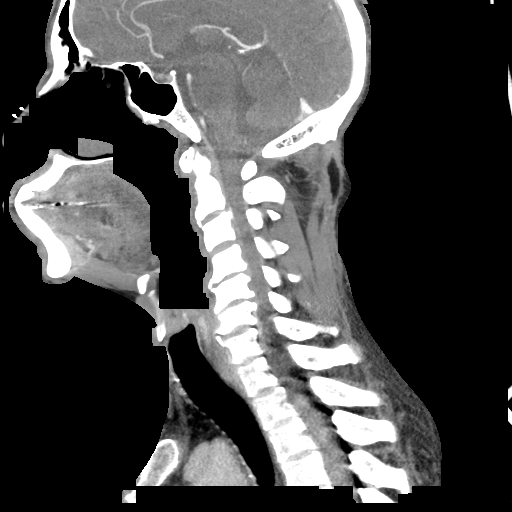
[im 57/113  bone]
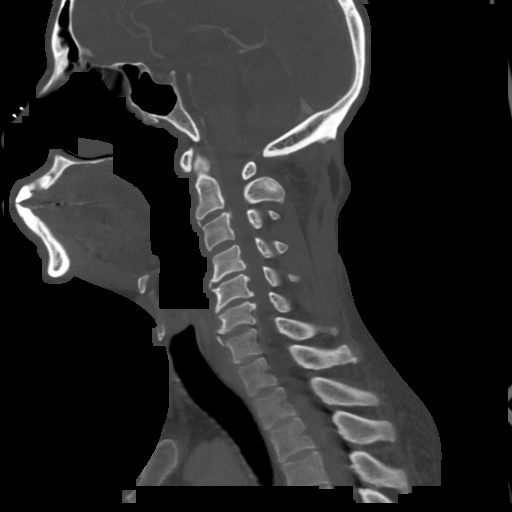
[im 66/113  bone]
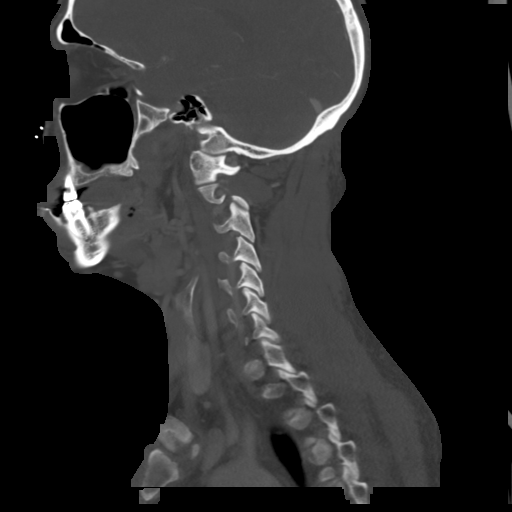
[im 75/113  bone]
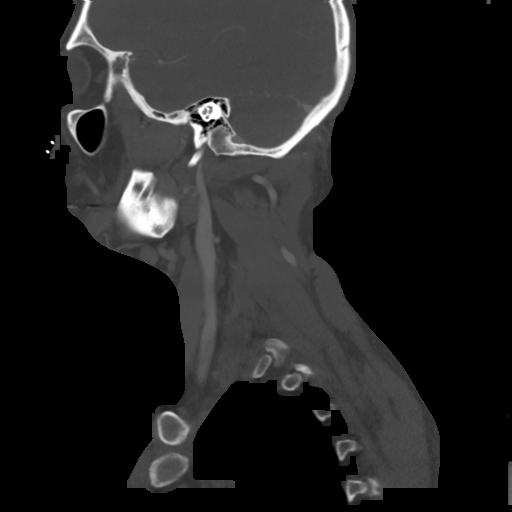

[Series 6: ax oropharynx · axial · 0.53mm/px · z∈[-188,-48]mm · 3 of 142 slices shown]
[im 36/142  bone]
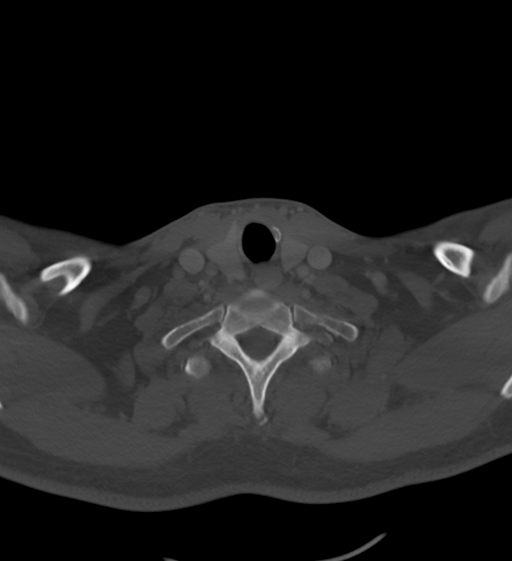
[im 71/142  bone]
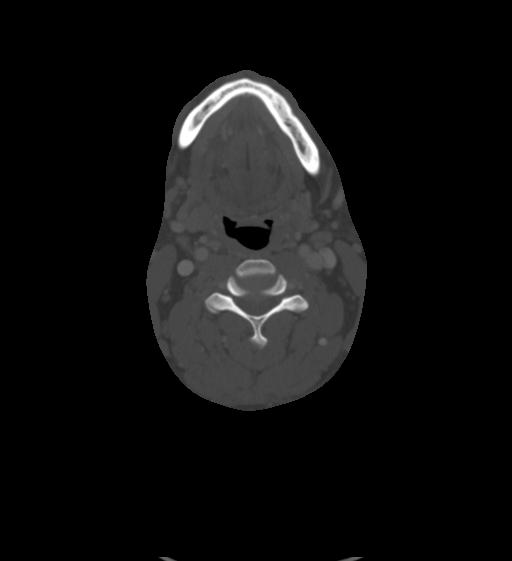
[im 106/142  bone]
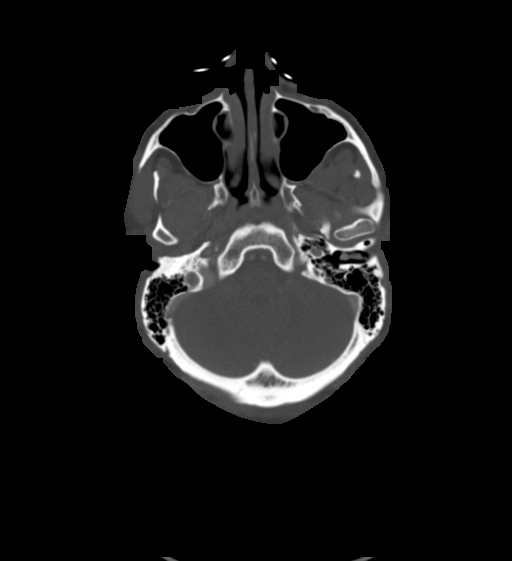

[16 of 33 positions shown; findings below may reference images not displayed]

FINDINGS: PHARYNX AND LARYNX: The nasopharynx, oropharynx and larynx are
normal. Visible portions of the oral cavity, tongue base and floor
of mouth are normal. Normal epiglottis, vallecula and pyriform
sinuses. The larynx is normal. No retropharyngeal abscess, effusion
or lymphadenopathy.

SALIVARY GLANDS: Normal parotid, submandibular and sublingual
glands.

THYROID: Normal.

LYMPH NODES: No enlarged or abnormal density lymph nodes.

VASCULAR: Major cervical vessels are patent.

LIMITED INTRACRANIAL: Normal.

VISUALIZED ORBITS: Normal.

MASTOIDS AND VISUALIZED PARANASAL SINUSES: No fluid levels or
advanced mucosal thickening. No mastoid effusion.

SKELETON: No bony spinal canal stenosis. No lytic or blastic
lesions.

UPPER CHEST: Clear.

OTHER:

Marked area: The site of the palpable abnormality at the lower right
neck was marked by the technologist prior to the scan using a
radiopaque marker. There is capsular hypertrophy at the right
sternoclavicular joint. Marker also overlies the proximal right
sternocleidomastoid muscle.
IMPRESSION: 1. Palpable abnormality at the lower right neck may correspond to
capsular hypertrophy at the right sternoclavicular joint.
2. No cervical lymphadenopathy.

## 2022-12-14 IMAGING — DX DG CHEST 2V
2 series · 2 of 2 positions shown · non-contrast
Comparison: 03/18/2020

CLINICAL DATA: Back pain

EXAM:
CHEST - 2 VIEW

[chest pa]
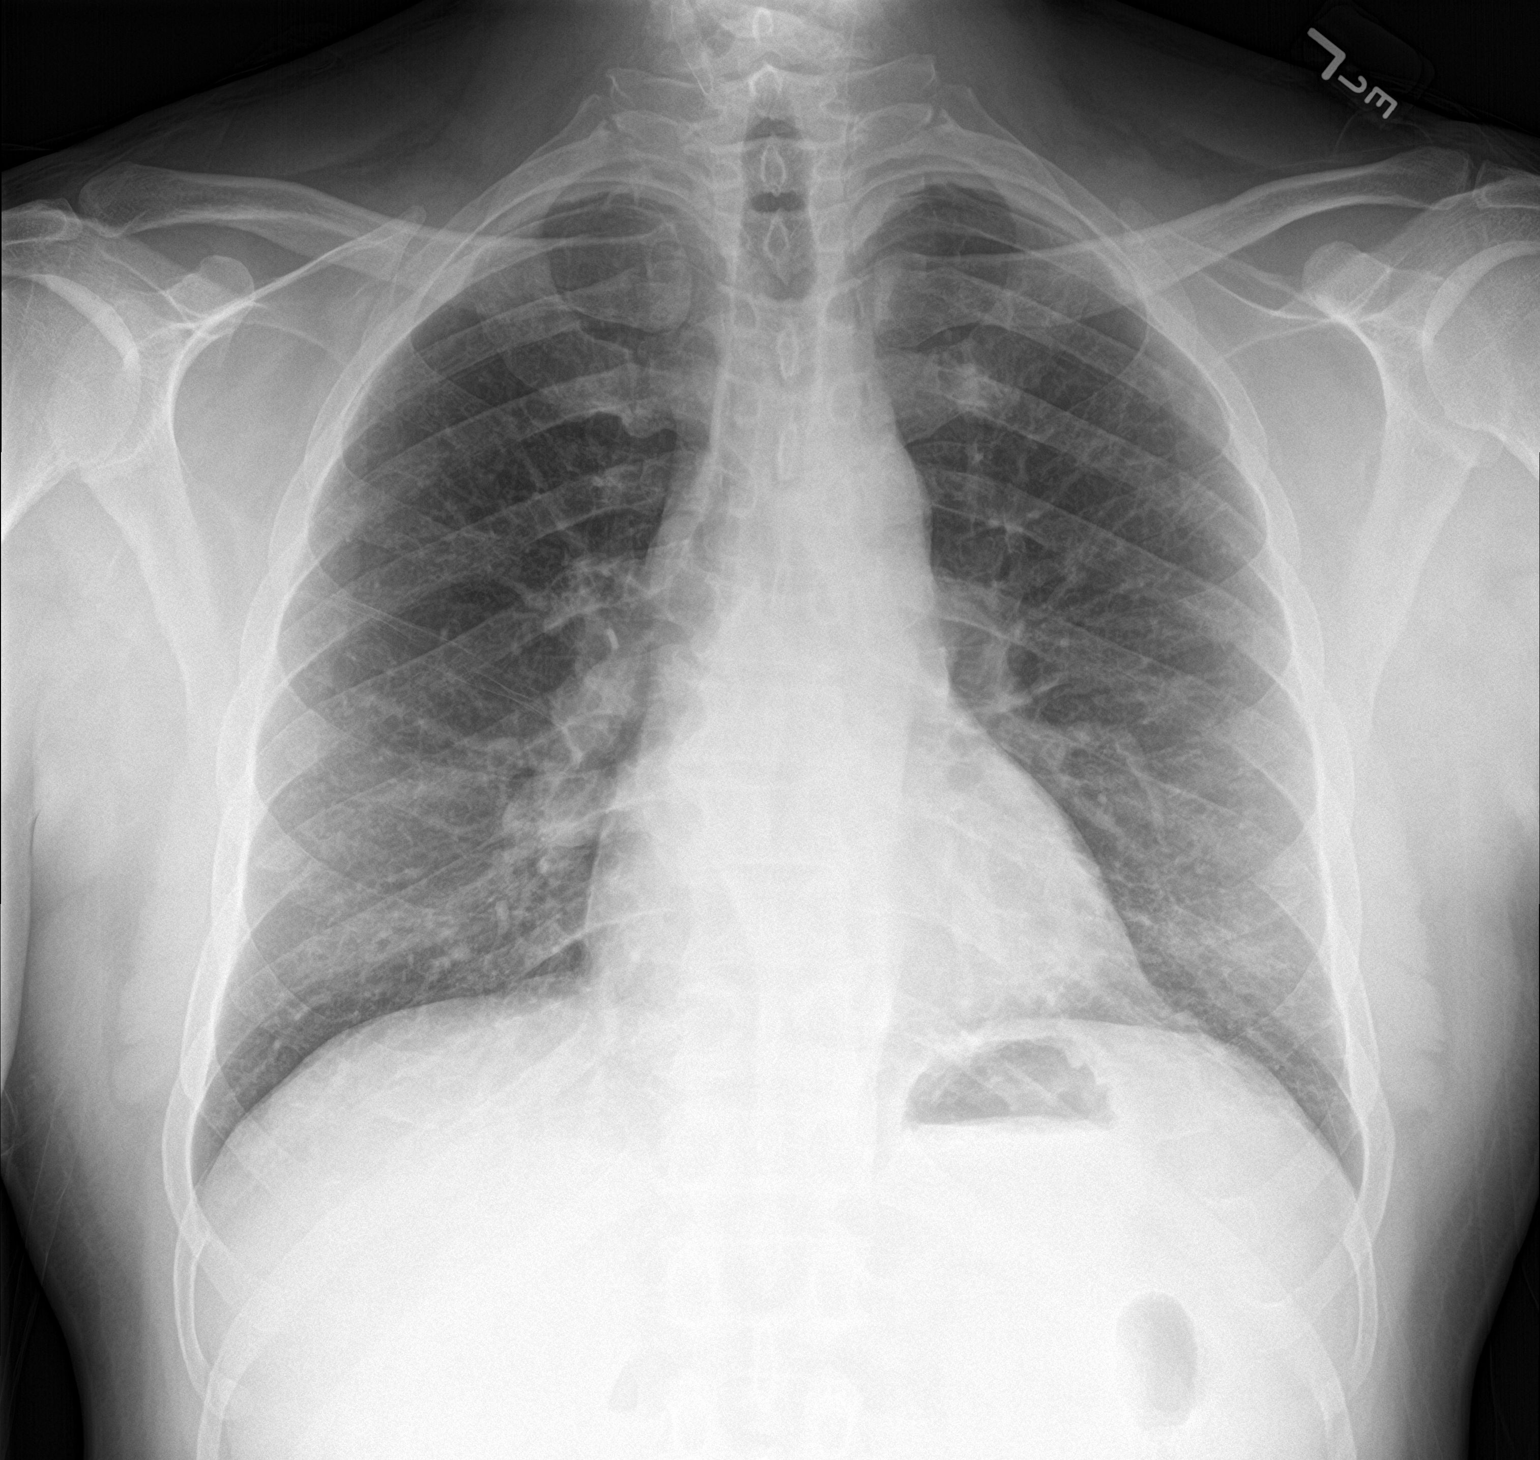

[chest lat]
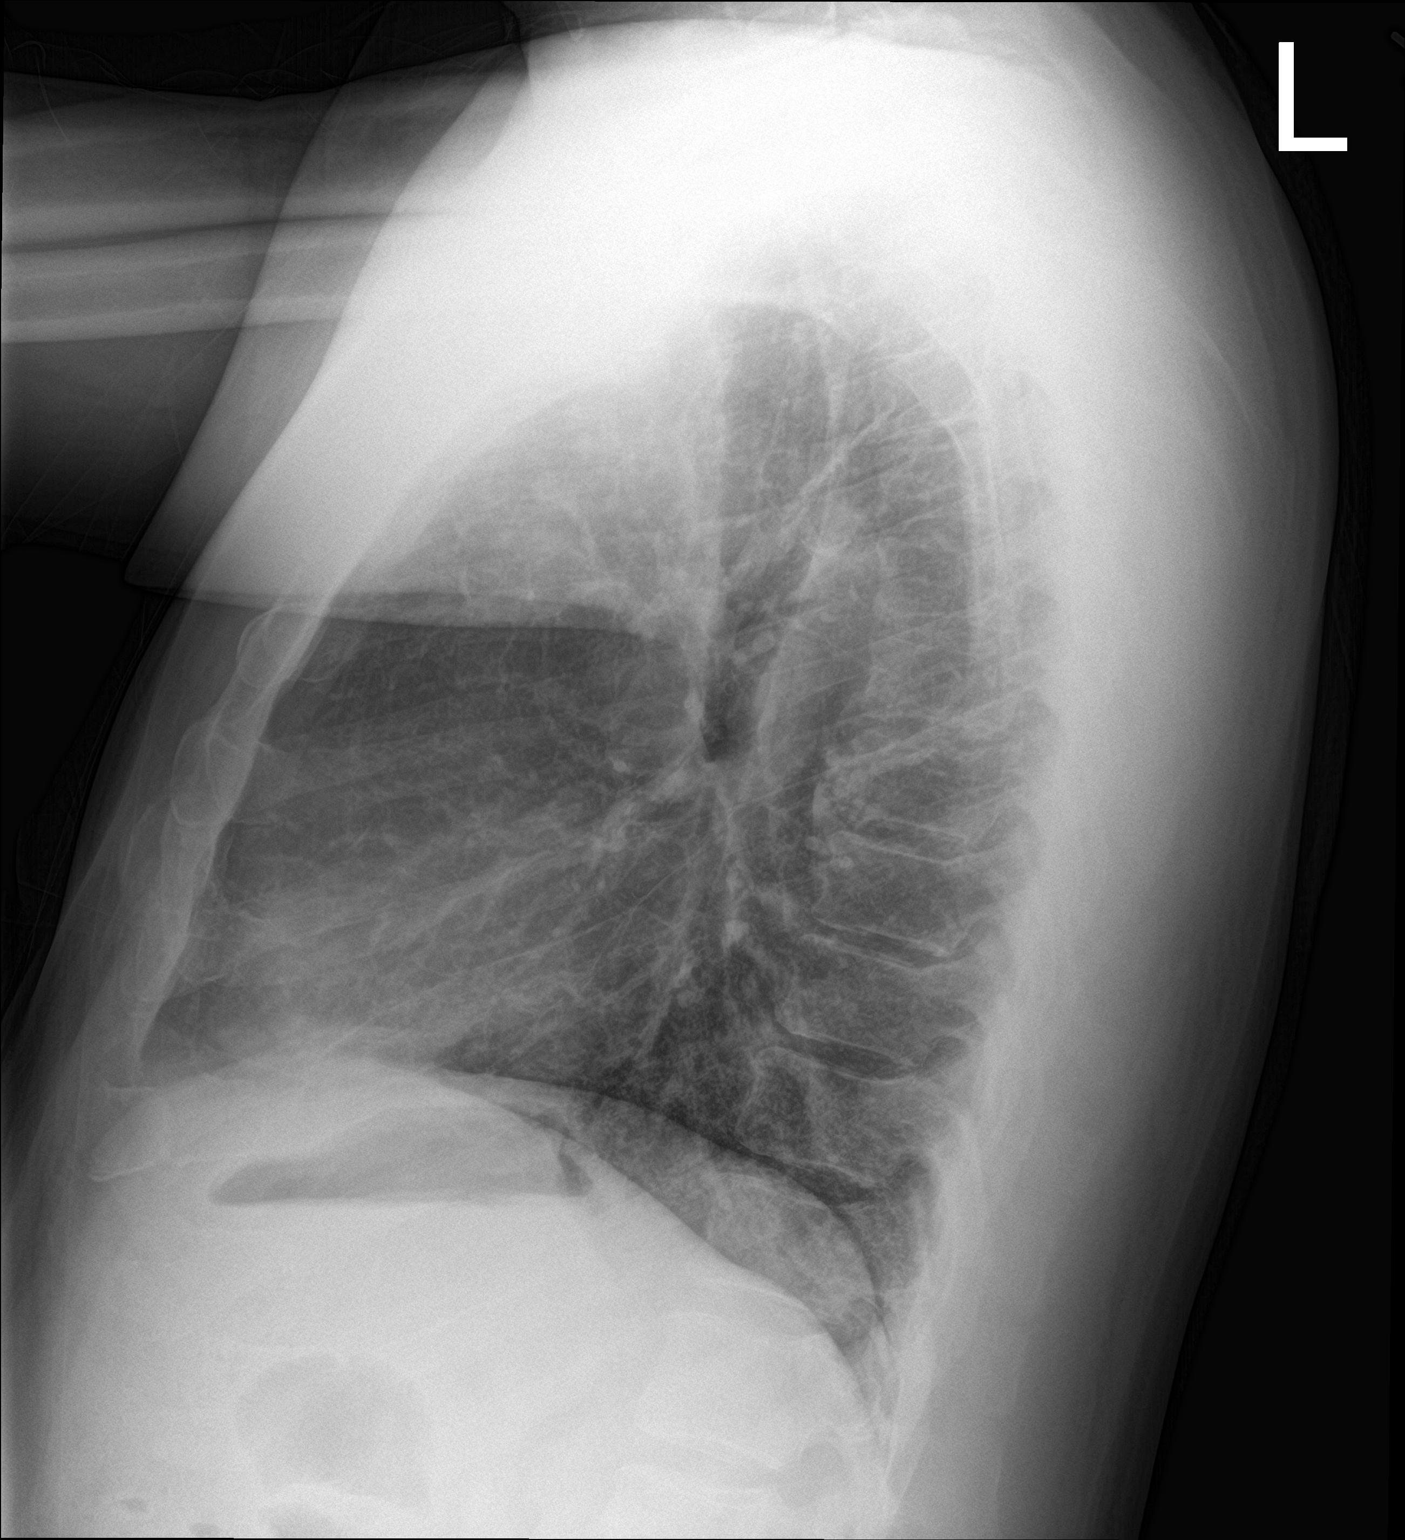

[2 of 2 positions shown; findings below may reference images not displayed]

FINDINGS: Extensive bibasilar pulmonary infiltrates noted on prior examination
have largely resolved. Minimal probable residual parenchymal
scarring and at the left lung base. The lungs are otherwise clear.
No pneumothorax or pleural effusion. Cardiac size within normal
limits. Pulmonary vascularity is normal. No acute bone abnormality.
IMPRESSION: No radiographic evidence of acute cardiopulmonary disease.

## 2023-05-18 ENCOUNTER — Other Ambulatory Visit: Payer: Self-pay

## 2023-05-18 ENCOUNTER — Encounter (HOSPITAL_BASED_OUTPATIENT_CLINIC_OR_DEPARTMENT_OTHER): Payer: Self-pay

## 2023-05-18 ENCOUNTER — Emergency Department (HOSPITAL_BASED_OUTPATIENT_CLINIC_OR_DEPARTMENT_OTHER)

## 2023-05-18 ENCOUNTER — Emergency Department (HOSPITAL_BASED_OUTPATIENT_CLINIC_OR_DEPARTMENT_OTHER)
Admission: EM | Admit: 2023-05-18 | Discharge: 2023-05-18 | Disposition: A | Discharge status: Home / Self Care | Attending: Emergency Medicine | Admitting: Emergency Medicine

## 2023-05-18 DIAGNOSIS — K047 Periapical abscess without sinus: Secondary | ICD-10-CM | POA: Insufficient documentation

## 2023-05-18 DIAGNOSIS — E119 Type 2 diabetes mellitus without complications: Secondary | ICD-10-CM | POA: Diagnosis not present

## 2023-05-18 DIAGNOSIS — Z7984 Long term (current) use of oral hypoglycemic drugs: Secondary | ICD-10-CM | POA: Diagnosis not present

## 2023-05-18 DIAGNOSIS — R220 Localized swelling, mass and lump, head: Secondary | ICD-10-CM | POA: Diagnosis present

## 2023-05-18 LAB — CBC WITH DIFFERENTIAL/PLATELET
Abs Immature Granulocytes: 0.01 10*3/uL (ref 0.00–0.07)
Basophils Absolute: 0 10*3/uL (ref 0.0–0.1)
Basophils Relative: 0 %
Eosinophils Absolute: 0.1 10*3/uL (ref 0.0–0.5)
Eosinophils Relative: 1 %
HCT: 43.8 % (ref 39.0–52.0)
Hemoglobin: 15.8 g/dL (ref 13.0–17.0)
Immature Granulocytes: 0 %
Lymphocytes Relative: 14 %
Lymphs Abs: 1.1 10*3/uL (ref 0.7–4.0)
MCH: 31.1 pg (ref 26.0–34.0)
MCHC: 36.1 g/dL — ABNORMAL HIGH (ref 30.0–36.0)
MCV: 86.2 fL (ref 80.0–100.0)
Monocytes Absolute: 0.6 10*3/uL (ref 0.1–1.0)
Monocytes Relative: 8 %
Neutro Abs: 5.9 10*3/uL (ref 1.7–7.7)
Neutrophils Relative %: 77 %
Platelets: 158 10*3/uL (ref 150–400)
RBC: 5.08 MIL/uL (ref 4.22–5.81)
RDW: 12.3 % (ref 11.5–15.5)
WBC: 7.7 10*3/uL (ref 4.0–10.5)
nRBC: 0 % (ref 0.0–0.2)

## 2023-05-18 LAB — BASIC METABOLIC PANEL WITH GFR
Anion gap: 7 (ref 5–15)
BUN: 10 mg/dL (ref 8–23)
CO2: 28 mmol/L (ref 22–32)
Calcium: 9.5 mg/dL (ref 8.9–10.3)
Chloride: 97 mmol/L — ABNORMAL LOW (ref 98–111)
Creatinine, Ser: 0.8 mg/dL (ref 0.61–1.24)
GFR, Estimated: >60 mL/min (ref >60)
Glucose, Bld: 244 mg/dL — ABNORMAL HIGH (ref 70–99)
Potassium: 4.3 mmol/L (ref 3.5–5.1)
Sodium: 132 mmol/L — ABNORMAL LOW (ref 135–145)

## 2023-05-18 MED ORDER — CLINDAMYCIN HCL 300 MG PO CAPS: 300.0000 mg | ORAL_CAPSULE | Freq: Four times a day (QID) | ORAL | 0 refills | Status: DC

## 2023-05-18 MED ORDER — IOHEXOL 300 MG/ML  SOLN
75.0000 mL | Freq: Once | INTRAMUSCULAR | Status: AC | PRN
  Administered 2023-05-18: 75 mL via INTRAVENOUS

## 2023-05-18 MED ORDER — MORPHINE SULFATE (PF) 4 MG/ML IV SOLN
4.0000 mg | Freq: Once | INTRAVENOUS | Status: AC
  Administered 2023-05-18: 4 mg via INTRAVENOUS
  Filled 2023-05-18: qty 1

## 2023-05-18 MED ORDER — OXYCODONE-ACETAMINOPHEN 5-325 MG PO TABS
1.0000 | ORAL_TABLET | Freq: Once | ORAL | Status: AC
  Administered 2023-05-18: 1 via ORAL
  Filled 2023-05-18: qty 1

## 2023-05-18 MED ORDER — CLINDAMYCIN PHOSPHATE 600 MG/50ML IV SOLN
600.0000 mg | Freq: Once | INTRAVENOUS | Status: AC
  Administered 2023-05-18: 600 mg via INTRAVENOUS
  Filled 2023-05-18: qty 50

## 2023-05-18 MED ORDER — HYDROCODONE-ACETAMINOPHEN 5-325 MG PO TABS: 1.0000 | ORAL_TABLET | Freq: Four times a day (QID) | ORAL | 0 refills | Status: DC | PRN

## 2023-05-18 MED ORDER — ONDANSETRON HCL 4 MG/2ML IJ SOLN
4.0000 mg | Freq: Once | INTRAMUSCULAR | Status: AC
  Administered 2023-05-18: 4 mg via INTRAVENOUS
  Filled 2023-05-18: qty 2

## 2023-05-18 NOTE — ED Notes (Signed)
 Pt c/o feeling light headed and sweaty

## 2023-05-18 NOTE — Discharge Instructions (Addendum)
Begin taking clindamycin as prescribed.  Take ibuprofen 600 mg every 6 hours as needed for pain.  Begin taking hydrocodone as prescribed as needed for pain not relieved with ibuprofen.  Follow-up with dentistry in the next few days.

## 2023-05-18 NOTE — ED Triage Notes (Signed)
 Pt states he noticed left sided facial swelling yesterday Facial pain and HA Feels that it is coming from some "bad teeth"

## 2023-05-18 NOTE — ED Notes (Signed)
 Pt presents with left sided facial swelling r/t possible abscessed teeth Pain has decreased some after percocet given

## 2023-05-18 NOTE — ED Provider Notes (Signed)
 [place] EMERGENCY DEPARTMENT AT [facility] Provider Note   CSN: [id number] Arrival date & time: 05/18/23  0014     History  Chief Complaint  Patient presents with   Facial Swelling    Vaughan Garfinkle is a 63 y.o. male.  Patient is a 63 year old male with history of diabetes.  Patient presenting today for evaluation of facial pain and swelling.  This started suddenly this morning.  He does have some bad teeth and believes he may have an infection.  No fevers or chills.       Home Medications Prior to Admission medications   Medication Sig Start Date End Date Taking? Authorizing Provider  metFORMIN (GLUCOPHAGE-XR) 500 MG 24 hr tablet TAKE 1 TABLET (500 MG TOTAL) BY MOUTH DAILY WITH BREAKFAST FOR 7 DAYS, THEN 2 TABLETS (1,000 MG TOTAL) DAILY WITH BREAKFAST FOR 7 DAYS. Patient not taking: Reported on 01/05/2021 02/15/20 02/14/21  Roylene Reason, MD  methocarbamol (ROBAXIN) 500 MG tablet Take 1 tablet (500 mg total) by mouth every 8 (eight) hours as needed for muscle spasms. 05/05/21   Geoffery Lyons, MD  predniSONE (DELTASONE) 10 MG tablet Take 2 tablets (20 mg total) by mouth 2 (two) times daily. 05/05/21   Geoffery Lyons, MD      Allergies    Patient has no known allergies.    Review of Systems   Review of Systems  All other systems reviewed and are negative.   Physical Exam Updated Vital Signs BP (!) 183/103 (BP Location: Right Arm)   Pulse 84   Temp 97.9 F (36.6 C)   Resp 16   Ht 6\' 2"  (1.88 m)   Wt 93.9 kg   SpO2 99%   BMI 26.58 kg/m  Physical Exam Vitals and nursing note reviewed.  Constitutional:      Appearance: Normal appearance.  HENT:     Head: Normocephalic and atraumatic.     Comments: There is swelling noted to the left cheek.    Mouth/Throat:     Comments: Patient with poor dentition.  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  There is surrounding gingival inflammation, but no obvious visible abscess. Pulmonary:      Effort: Pulmonary effort is normal.  Skin:    General: Skin is warm and dry.  Neurological:     Mental Status: He is alert and oriented to person, place, and time.     ED Results / Procedures / Treatments   Labs (all labs ordered are listed, but only abnormal results are displayed) Labs Reviewed  BASIC METABOLIC PANEL WITH GFR - Abnormal; Notable for the following components:      Result Value   Sodium 132 (*)    Chloride 97 (*)    Glucose, Bld 244 (*)    All other components within normal limits  CBC WITH DIFFERENTIAL/PLATELET - Abnormal; Notable for the following components:   MCHC 36.1 (*)    All other components within normal limits    EKG None  Radiology CT Maxillofacial W Contrast Result Date: 05/18/2023 CLINICAL DATA:  Sublingual/submandibular abscess EXAM: CT MAXILLOFACIAL WITH CONTRAST TECHNIQUE: Multidetector CT imaging of the maxillofacial structures was performed with intravenous contrast. Multiplanar CT image reconstructions were also generated. RADIATION DOSE REDUCTION: This exam was performed according to the departmental dose-optimization program which includes automated exposure control, adjustment of the mA and/or kV according to patient size and/or use of iterative reconstruction technique. CONTRAST:  75mL OMNIPAQUE IOHEXOL 300 MG/ML  SOLN  COMPARISON:  None Available. FINDINGS: Osseous: No acute fracture.  TMJs are located. Orbits: Negative. No traumatic or inflammatory finding. Sinuses: Moderate left maxillary sinus mucosal thickening. Soft tissues: Approximately 7 x 10 x 3 mm peripherally enhancing fluid collection along the buccal aspect of the left maxilla (see series 2, image 44 and series 6, image 64). This is adjacent to the posterior-most left maxillary tooth with periapical lucency. There is surrounding edema and soft tissue thickening. Limited intracranial: No significant or unexpected finding. IMPRESSION: 1. Approximately 10 mm abscess along the buccal aspect  of the left maxilla with surrounding cellulitis/phlegmon. This is likely odontogenic given disease adjacent posterior-most left maxillary tooth with periapical lucency. 2. Moderate inferior left maxillary sinus mucosal thickening, likely also odontogenic. Electronically Signed   By: Stevenson Elbe M.D.   On: 05/18/2023 02:54    Procedures Procedures    Medications Ordered in ED Medications  clindamycin (CLEOCIN) IVPB 600 mg (has no administration in time range)  oxyCODONE-acetaminophen (PERCOCET/ROXICET) 5-325 MG per tablet 1 tablet (1 tablet Oral Given 05/18/23 0043)  iohexol (OMNIPAQUE) 300 MG/ML solution 75 mL (75 mLs Intravenous Contrast Given 05/18/23 0230)    ED Course/ Medical Decision Making/ A&P  Patient is a 62 year old male presenting with facial swelling and dental pain as described in the HPI.  Patient arrives with stable vital signs and is afebrile.  Physical examination reveals poor dentition is swelling of the left cheek.  Laboratory studies obtained including CBC and basic metabolic panel, both of which are unremarkable.  CT scan of the maxillofacial with contrast obtained showing an approximately 10 mm abscess along the buccal aspect of the left maxilla with surrounding cellulitis/phlegmon.  This is likely odontogenic in nature.  Patient was received IV clindamycin along with morphine and Zofran.  Patient to be discharged with clindamycin and hydrocodone.  He is to follow-up with dentistry.  Final Clinical Impression(s) / ED Diagnoses Final diagnoses:  None    Rx / DC Orders ED Discharge Orders     None         Orvilla Blander, MD 05/18/23 [phone]

## 2023-05-18 NOTE — ED Notes (Signed)
 Patient transported to CT

## 2023-05-22 ENCOUNTER — Emergency Department (HOSPITAL_BASED_OUTPATIENT_CLINIC_OR_DEPARTMENT_OTHER): Admission: EM | Admit: 2023-05-22 | Source: Home / Self Care

## 2023-05-24 ENCOUNTER — Telehealth: Payer: Self-pay

## 2023-05-24 NOTE — Transitions of Care (Post Inpatient/ED Visit) (Signed)
   05/24/2023  Name: Raymond Lewis MRN: [id number] DOB: 08-05-1960  Today's TOC FU Call Status:   Patient's Name and Date of Birth confirmed.  Transition Care Management Follow-up Telephone Call Date of Discharge: 05/18/23 Discharge Facility: [facility] ([facility]) Type of Discharge: Emergency Department Reason for ED Visit: Other: How have you been since you were released from the hospital?: Same Any questions or concerns?: No  Items Reviewed: Did you receive and understand the discharge instructions provided?: Yes Medications obtained,verified, and reconciled?: Yes (Medications Reviewed) Any new allergies since your discharge?: No Dietary orders reviewed?: No Do you have support at home?: Yes People in Home [RPT]: significant other Name of Support/Comfort Primary Source: Wife  Medications Reviewed Today: Medications Reviewed Today     Reviewed by Angelita Bares, CMA (Certified Medical Assistant) on 05/24/23 at [phone]  Med List Status: <None>   Medication Order Taking? Sig Documenting Provider Last Dose Status Informant  clindamycin  (CLEOCIN ) 300 MG capsule [id number] Yes Take 1 capsule (300 mg total) by mouth 4 (four) times daily. X 7 days Orvilla Blander, MD Taking Active   HYDROcodone -acetaminophen  (NORCO/VICODIN) 5-325 MG tablet [id number] Yes Take 1-2 tablets by mouth every 6 (six) hours as needed. Orvilla Blander, MD Taking Active   metFORMIN  (GLUCOPHAGE -XR) 500 MG 24 hr tablet [id number] No TAKE 1 TABLET (500 MG TOTAL) BY MOUTH DAILY WITH BREAKFAST FOR 7 DAYS, THEN 2 TABLETS (1,000 MG TOTAL) DAILY WITH BREAKFAST FOR 7 DAYS.  Patient not taking: Reported on 05/24/2023   Marya Smack, MD Not Taking Expired 02/14/21 2359   methocarbamol  (ROBAXIN ) 500 MG tablet [id number] Yes Take 1 tablet (500 mg total) by mouth every 8 (eight) hours as needed for muscle spasms. Orvilla Blander, MD Taking Active   predniSONE  (DELTASONE ) 10 MG tablet [id number] No Take 2 tablets (20 mg total) by  mouth 2 (two) times daily.  Patient not taking: Reported on 05/24/2023   Orvilla Blander, MD Not Taking Active             Home Care and Equipment/Supplies: Were Home Health Services Ordered?: No Any new equipment or medical supplies ordered?: No  Functional Questionnaire: Do you need assistance with bathing/showering or dressing?: No Do you need assistance with meal preparation?: No Do you need assistance with eating?: No Do you have difficulty maintaining continence: No Do you need assistance with getting out of bed/getting out of a chair/moving?: No Do you have difficulty managing or taking your medications?: No  Follow up appointments reviewed: PCP Follow-up appointment confirmed?: Yes Date of PCP follow-up appointment?: 06/03/23 Specialist Hospital Follow-up appointment confirmed?: NA Do you need transportation to your follow-up appointment?: No Do you understand care options if your condition(s) worsen?: Yes-patient verbalized understanding  SDOH Interventions Today    Flowsheet Row Most Recent Value  SDOH Interventions   Food Insecurity Interventions Other (Comment)  Housing Interventions Intervention Not Indicated  Transportation Interventions Intervention Not Indicated       SIGNATURE Lennon Richins [place], RMA

## 2023-06-03 ENCOUNTER — Ambulatory Visit (INDEPENDENT_AMBULATORY_CARE_PROVIDER_SITE_OTHER): Admitting: Nurse Practitioner

## 2023-06-03 VITALS — BP 141/88 | Ht 74.0 in | Wt 208.0 lb

## 2023-06-03 DIAGNOSIS — E559 Vitamin D deficiency, unspecified: Secondary | ICD-10-CM | POA: Diagnosis not present

## 2023-06-03 DIAGNOSIS — R739 Hyperglycemia, unspecified: Secondary | ICD-10-CM

## 2023-06-03 DIAGNOSIS — E538 Deficiency of other specified B group vitamins: Secondary | ICD-10-CM

## 2023-06-03 DIAGNOSIS — M79671 Pain in right foot: Secondary | ICD-10-CM | POA: Diagnosis not present

## 2023-06-03 DIAGNOSIS — E0800 Diabetes mellitus due to underlying condition with hyperosmolarity without nonketotic hyperglycemic-hyperosmolar coma (NKHHC): Secondary | ICD-10-CM | POA: Diagnosis not present

## 2023-06-03 DIAGNOSIS — M79672 Pain in left foot: Secondary | ICD-10-CM

## 2023-06-03 DIAGNOSIS — E1100 Type 2 diabetes mellitus with hyperosmolarity without nonketotic hyperglycemic-hyperosmolar coma (NKHHC): Secondary | ICD-10-CM

## 2023-06-03 DIAGNOSIS — Z0000 Encounter for general adult medical examination without abnormal findings: Secondary | ICD-10-CM

## 2023-06-03 DIAGNOSIS — Z125 Encounter for screening for malignant neoplasm of prostate: Secondary | ICD-10-CM

## 2023-06-03 DIAGNOSIS — H547 Unspecified visual loss: Secondary | ICD-10-CM

## 2023-06-03 DIAGNOSIS — Z13220 Encounter for screening for lipoid disorders: Secondary | ICD-10-CM

## 2023-06-03 LAB — POCT GLYCOSYLATED HEMOGLOBIN (HGB A1C): Hemoglobin A1C: 9 % — AB (ref 4.0–5.6)

## 2023-06-03 NOTE — Progress Notes (Signed)
 Subjective   Patient ID: Raymond Lewis, male    DOB: [birth year], 63 y.o.   MRN: [id number]  Chief Complaint  Patient presents with   Hospitalization Follow-up    Hospital follow up , swelling in face , discuss getting a referral , pt stated that he isn't current no t having pain     Referring provider: Jerrlyn Morel, NP  Darrin Dervisevic is a 63 y.o. male with Past Medical History: No date: COVID-19 No date: Pneumonia   HPI  Patient presents today for follow-up.  He recently had facial swelling and was seen in the ED for dental abscess.  He was given antibiotics and states that the dental pain has subsided.  He does still need to get an appointment with a dentist.  Patient would like a full blood panel including vitamin levels today.  It was noted at the hospital visit that patient's glucose was elevated.  A1c in office today was 9.0.  Patient would like to trial lifestyle modifications before starting on any medications. Denies f/c/s, n/v/d, hemoptysis, PND, leg swelling Denies chest pain or edema     Allergies  Allergen Reactions   Percocet [Oxycodone -Acetaminophen ] Other (See Comments)    DIAPHORETIC & NAUSEA     There is no immunization history on file for this patient.  Tobacco History: Social History   Tobacco Use  Smoking Status Never  Smokeless Tobacco Never   Counseling given: Not Answered   Outpatient Encounter Medications as of 06/03/2023  Medication Sig   [DISCONTINUED] clindamycin  (CLEOCIN ) 300 MG capsule Take 1 capsule (300 mg total) by mouth 4 (four) times daily. X 7 days (Patient not taking: Reported on 06/03/2023)   [DISCONTINUED] HYDROcodone -acetaminophen  (NORCO/VICODIN) 5-325 MG tablet Take 1-2 tablets by mouth every 6 (six) hours as needed.   [DISCONTINUED] metFORMIN  (GLUCOPHAGE -XR) 500 MG 24 hr tablet TAKE 1 TABLET (500 MG TOTAL) BY MOUTH DAILY WITH BREAKFAST FOR 7 DAYS, THEN 2 TABLETS (1,000 MG TOTAL) DAILY WITH BREAKFAST FOR 7 DAYS. (Patient not  taking: Reported on 05/24/2023)   [DISCONTINUED] methocarbamol  (ROBAXIN ) 500 MG tablet Take 1 tablet (500 mg total) by mouth every 8 (eight) hours as needed for muscle spasms. (Patient not taking: Reported on 06/03/2023)   [DISCONTINUED] predniSONE  (DELTASONE ) 10 MG tablet Take 2 tablets (20 mg total) by mouth 2 (two) times daily. (Patient not taking: Reported on 05/24/2023)   No facility-administered encounter medications on file as of 06/03/2023.    Review of Systems  Review of Systems  Constitutional: Negative.   HENT: Negative.    Cardiovascular: Negative.   Gastrointestinal: Negative.   Allergic/Immunologic: Negative.   Neurological: Negative.   Psychiatric/Behavioral: Negative.       Objective:   BP (!) 141/88 (BP Location: Left Arm, Patient Position: Sitting, Cuff Size: Normal)   Ht 6\' 2"  (1.88 m)   Wt 208 lb (94.3 kg)   SpO2 100%   BMI 26.71 kg/m   Wt Readings from Last 5 Encounters:  06/03/23 208 lb (94.3 kg)  05/18/23 207 lb (93.9 kg)  05/05/21 205 lb (93 kg)  01/05/21 209 lb (94.8 kg)  03/27/20 200 lb (90.7 kg)     Physical Exam Vitals and nursing note reviewed.  Constitutional:      General: He is not in acute distress.    Appearance: He is well-developed.  Cardiovascular:     Rate and Rhythm: Normal rate and regular rhythm.  Pulmonary:     Effort: Pulmonary effort is normal.  Breath sounds: Normal breath sounds.  Skin:    General: Skin is warm and dry.  Neurological:     Mental Status: He is alert and oriented to person, place, and time.       Assessment & Plan:   Routine health maintenance -     CBC -     Comprehensive metabolic panel with GFR -     VITAMIN D 25 Hydroxy (Vit-D Deficiency, Fractures) -     Vitamin B12 -     Magnesium  Vitamin D deficiency -     VITAMIN D 25 Hydroxy (Vit-D Deficiency, Fractures)  Vitamin B12 deficiency -     Vitamin B12  Bilateral foot pain -     Ambulatory referral to Podiatry  Decreased vision -      Ambulatory referral to Optometry  Lipid screening -     Lipid panel  Prostate cancer screening -     PSA  Hyperglycemia  Diabetes mellitus due to underlying condition with hyperosmolarity without coma, unspecified whether long term insulin  use (HCC) -     POCT glycosylated hemoglobin (Hb A1C)     Return in about 3 months (around 09/03/2023).   Jerrlyn Morel, NP 06/03/2023

## 2023-06-03 NOTE — Patient Instructions (Addendum)
     Dental Resources  [facility]   [address]   Cleaning: $5 [place], [place] [address]  Xray: $5 [phone]. [phone]  Call to get on waiting list   Dr. Royston Cornea McMasters/ Dr. Bonnielee Buttery [address]    Xray: $85 each [place], [place] [address]  Extraction $200 [phone]   Dr. Lovena Rubinstein Turner/ Dr. Glorine Laroche  [address]    Exam, Cleaning, Xray: $262 [place]   Extraction: $324-$401 [phone]  Dr. Michiel Ala   [address]   Extraction: $300 per tooth  [place] [address]  [phone]   Dr. Crissie Dome  [address]    Cleaning: $300  [place], [place] [address]  Extraction: [phone]  [place]   Dr. Alessandra Ancona  [address]    Exam: $85 [place], [place]    Extraction: $120 and up  [phone]    *full list of prices available*  [facility] [address]  Exam: $94 [place], [place]    Exam with Xrays: $380  [phone]    Xrays: $68 and up       Cleaning: $101       Extraction $190 and up  [facility]    [address]    Cleaning + Xray: $344 [place]    Extraction: pt must be seen first for price [phone]  [facility] [address]   Emergency Exam: $65 [place] [address]   Cleaning & Exam: $150  [phone]    Extractions: simple $180; surgical $250      Fillings: [id number]

## 2023-06-04 LAB — CBC
Hematocrit: 46.8 % (ref 37.5–51.0)
Hemoglobin: 16.2 g/dL (ref 13.0–17.7)
MCH: 31.2 pg (ref 26.6–33.0)
MCHC: 34.6 g/dL (ref 31.5–35.7)
MCV: 90 fL (ref 79–97)
Platelets: 189 10*3/uL (ref 150–450)
RBC: 5.2 x10E6/uL (ref 4.14–5.80)
RDW: 13.1 % (ref 11.6–15.4)
WBC: 4.9 10*3/uL (ref 3.4–10.8)

## 2023-06-04 LAB — LIPID PANEL
Chol/HDL Ratio: 4.1 ratio (ref 0.0–5.0)
Cholesterol, Total: 154 mg/dL (ref 100–199)
HDL: 38 mg/dL — ABNORMAL LOW (ref >39)
LDL Chol Calc (NIH): 85 mg/dL (ref 0–99)
Triglycerides: 181 mg/dL — ABNORMAL HIGH (ref 0–149)
VLDL Cholesterol Cal: 31 mg/dL (ref 5–40)

## 2023-06-04 LAB — COMPREHENSIVE METABOLIC PANEL WITH GFR
ALT: 27 IU/L (ref 0–44)
AST: 19 IU/L (ref 0–40)
Albumin: 4.5 g/dL (ref 3.9–4.9)
Alkaline Phosphatase: 89 IU/L (ref 44–121)
BUN/Creatinine Ratio: 12 (ref 10–24)
BUN: 11 mg/dL (ref 8–27)
Bilirubin Total: 1.3 mg/dL — ABNORMAL HIGH (ref 0.0–1.2)
CO2: 19 mmol/L — ABNORMAL LOW (ref 20–29)
Calcium: 9.9 mg/dL (ref 8.6–10.2)
Chloride: 102 mmol/L (ref 96–106)
Creatinine, Ser: 0.91 mg/dL (ref 0.76–1.27)
Globulin, Total: 3 g/dL (ref 1.5–4.5)
Glucose: 192 mg/dL — ABNORMAL HIGH (ref 70–99)
Potassium: 4.7 mmol/L (ref 3.5–5.2)
Sodium: 137 mmol/L (ref 134–144)
Total Protein: 7.5 g/dL (ref 6.0–8.5)
eGFR: 96 mL/min/{1.73_m2} (ref >59)

## 2023-06-04 LAB — VITAMIN B12: Vitamin B-12: 211 pg/mL — ABNORMAL LOW (ref 232–1245)

## 2023-06-04 LAB — VITAMIN D 25 HYDROXY (VIT D DEFICIENCY, FRACTURES): Vit D, 25-Hydroxy: 32 ng/mL (ref 30.0–100.0)

## 2023-06-04 LAB — PSA: Prostate Specific Ag, Serum: 0.3 ng/mL (ref 0.0–4.0)

## 2023-06-04 LAB — MAGNESIUM: Magnesium: 2 mg/dL (ref 1.6–2.3)

## 2023-06-06 ENCOUNTER — Other Ambulatory Visit: Payer: Self-pay | Admitting: Nurse Practitioner

## 2023-06-06 MED ORDER — VITAMIN B-12 1000 MCG PO TABS: 1000.0000 ug | ORAL_TABLET | Freq: Every day | ORAL | 2 refills | Status: DC

## 2023-06-09 ENCOUNTER — Telehealth: Payer: Self-pay

## 2023-06-09 NOTE — Telephone Encounter (Signed)
 Copied from CRM [id number]. Topic: Referral - Question >> Jun 09, 2023  2:10 PM Sophia H wrote: Reason for CRM: Spoke with Loetta Ringer from [facility] where patient was referred out to. Stated they are not in network with medicaid at all, please send new referral to in network provider. Ty  Please advise . Thank you. kh

## 2023-06-10 NOTE — Telephone Encounter (Signed)
 Referral sent to [facility] In [place]. MyChart message sent to patient.  Patient may also call his insurance to find a provider.

## 2023-06-23 ENCOUNTER — Ambulatory Visit (INDEPENDENT_AMBULATORY_CARE_PROVIDER_SITE_OTHER)

## 2023-06-23 ENCOUNTER — Encounter: Payer: Self-pay | Admitting: Podiatry

## 2023-06-23 ENCOUNTER — Ambulatory Visit (INDEPENDENT_AMBULATORY_CARE_PROVIDER_SITE_OTHER): Admitting: Podiatry

## 2023-06-23 DIAGNOSIS — M79675 Pain in left toe(s): Secondary | ICD-10-CM | POA: Diagnosis not present

## 2023-06-23 DIAGNOSIS — M79674 Pain in right toe(s): Secondary | ICD-10-CM | POA: Diagnosis not present

## 2023-06-23 DIAGNOSIS — M7752 Other enthesopathy of left foot: Secondary | ICD-10-CM

## 2023-06-23 DIAGNOSIS — M778 Other enthesopathies, not elsewhere classified: Secondary | ICD-10-CM

## 2023-06-23 DIAGNOSIS — E1149 Type 2 diabetes mellitus with other diabetic neurological complication: Secondary | ICD-10-CM | POA: Diagnosis not present

## 2023-06-23 DIAGNOSIS — B49 Unspecified mycosis: Secondary | ICD-10-CM

## 2023-06-23 DIAGNOSIS — M7751 Other enthesopathy of right foot: Secondary | ICD-10-CM

## 2023-06-23 DIAGNOSIS — M722 Plantar fascial fibromatosis: Secondary | ICD-10-CM | POA: Diagnosis not present

## 2023-06-23 DIAGNOSIS — B351 Tinea unguium: Secondary | ICD-10-CM

## 2023-06-23 NOTE — Progress Notes (Unsigned)
 [facility]US

## 2023-06-23 NOTE — Patient Instructions (Signed)

## 2023-06-27 NOTE — Progress Notes (Signed)
 Subjective:  Patient ID: Raymond Lewis, male    DOB: 01/02/1961,  MRN: [id number]  Chief Complaint  Patient presents with   Nail Problem    Fungal nails. Severe COVID had nails fallen off and discoloration since 2019/07/28. Non diabteic.    Discussed the use of AI scribe software for clinical note transcription with the patient, who gave verbal consent to proceed.  History of Present Illness Raymond Lewis is a 63 year old male who presents with foot pain and toenail issues.  He experiences significant foot pain since 07-28-19, more pronounced when off his feet, with discomfort primarily in the middle of the bottom and top of his feet. There is no pain when pressing on his ankles or the back of his feet. He occasionally soaks his feet in warm water and baking soda for relief.  Toenail issues have persisted for about twenty years, associated with a past pedicure. During a severe COVID-19 illness, both toenails and fingernails fell off and regrew with a curvature, causing significant pain at the time.  He experiences light numbness in his feet, progressing to a burning sensation if he does not soak his feet for about a month. He wears sneakers with good arch support during the day and different shoes on weekends, with no significant difference in pain with different footwear.  His last A1c was 9 on March 09, 2023   Objective:    Physical Exam General: AAO x3, NAD  Dermatological: Nails are all hypertrophic, dystrophic with brown discoloration there is some hyperpigmentation present along the majority the toenails but they appear be uniform in color similar on.  There is no extension of hyperpigmentation of the surrounding skin.  There is no edema, erythema lesions.  He gets tenderness nails 1-5 bilaterally.  Vascular: Dorsalis Pedis artery and Posterior Tibial artery pedal pulses are 2/4 bilateral with immedate capillary fill time. There is no pain with calf compression, swelling, warmth,  erythema.   Neruologic: Grossly intact via light touch bilateral.  Sensation intact with Semmes Weinstein monofilament.  Musculoskeletal: Majority discomfort is noted along the arch of the foot bilaterally as well as the dorsal midfoot there is no specific area pinpoint tenderness.  Flexor, extensor tendons appear to be intact.  MMT 5/5.   No images are attached to the encounter.    Results    Assessment:   1. Capsulitis of foot   2. Fungus infection   3      Plantar fasciitis  4.     Neuropathy    Plan:  Patient was evaluated and treated and all questions answered.  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 Numbness and burning suggest neuropathy. - Order foot x-rays to assess for arthritis or structural issues. - Evaluate footwear and recommend appropriate shoes or inserts.  We discussed wearing shoes with better arch support.  Discussed stretching exercises daily as well as using a frozen water bottle to the arch of the  Peripheral neuropathy Intermittent numbness and burning in feet, possibly secondary to high blood sugar from insulin -based steroids post-COVID-19. - Discuss potential use of medications for neuropathic pain if needed.  Symptomatic onychomycosis Chronic toenail changes suggestive of fungal infection, possibly exacerbated by pedicure. - Sharply debrided nails x 10 without any complications or bleeding.  Sent for culture, pathology.  Radiology: X-rays were obtained and reviewed bilaterally.  Multiple views obtained bilaterally.  There is no evidence of acute fracture.  Decreased calcaneal inclination angle.  No follow-ups on file.   Charity Conch DPM

## 2023-06-29 ENCOUNTER — Ambulatory Visit: Payer: Self-pay | Admitting: Podiatry

## 2023-07-08 ENCOUNTER — Ambulatory Visit: Payer: Self-pay | Admitting: Podiatry

## 2023-07-08 LAB — CULTURE, FUNGUS WITHOUT SMEAR
MICRO NUMBER:: 16489769
SPECIMEN QUALITY:: ADEQUATE

## 2023-07-24 ENCOUNTER — Telehealth: Payer: Self-pay | Admitting: Nurse Practitioner

## 2023-07-24 NOTE — Telephone Encounter (Signed)
 Patient was identified as falling into the [place] Measure - Diabetes.   Patient was: Appointment already scheduled for:  09/07/23.

## 2023-07-26 ENCOUNTER — Ambulatory Visit: Admitting: Podiatry

## 2023-08-17 ENCOUNTER — Ambulatory Visit (INDEPENDENT_AMBULATORY_CARE_PROVIDER_SITE_OTHER)

## 2023-08-17 ENCOUNTER — Ambulatory Visit (HOSPITAL_COMMUNITY)
Admission: EM | Admit: 2023-08-17 | Discharge: 2023-08-17 | Disposition: A | Discharge status: Home / Self Care | Attending: Physician Assistant | Admitting: Physician Assistant

## 2023-08-17 ENCOUNTER — Encounter (HOSPITAL_COMMUNITY): Payer: Self-pay

## 2023-08-17 DIAGNOSIS — R051 Acute cough: Secondary | ICD-10-CM

## 2023-08-17 DIAGNOSIS — J40 Bronchitis, not specified as acute or chronic: Secondary | ICD-10-CM | POA: Diagnosis not present

## 2023-08-17 DIAGNOSIS — J329 Chronic sinusitis, unspecified: Secondary | ICD-10-CM

## 2023-08-17 MED ORDER — FLUTICASONE PROPIONATE 50 MCG/ACT NA SUSP: 1.0000 | Freq: Every day | NASAL | 0 refills | Status: DC

## 2023-08-17 MED ORDER — AMOXICILLIN-POT CLAVULANATE 875-125 MG PO TABS
1.0000 | ORAL_TABLET | Freq: Two times a day (BID) | ORAL | 0 refills | Status: DC
Start: 2023-08-17 — End: 2023-08-31

## 2023-08-17 MED ORDER — PROMETHAZINE-DM 6.25-15 MG/5ML PO SYRP
5.0000 mL | ORAL_SOLUTION | Freq: Every evening | ORAL | 0 refills | Status: DC | PRN
Start: 2023-08-17 — End: 2023-08-31
  Filled 2023-08-18: qty 50, 10d supply, fill #0

## 2023-08-17 NOTE — ED Triage Notes (Signed)
 Patient reports cough and congestion x 2 weeks. Patient states diarrhea and vomiting. Patient was expose to Pneumonia.

## 2023-08-17 NOTE — Discharge Instructions (Signed)
 Your x-ray did not show any evidence of pneumonia which is great news.  I am concerned that you have a bacterial infection in your sinuses that is causing inflammation in your airway.  Start Augmentin  twice daily for 7 days.  Use fluticasone  nasal spray as well as over-the-counter medicine such as Mucinex .  I recommend nasal saline and sinus rinses.  Use Promethazine  DM for cough.  This will make you sleepy so do not drive or drink alcohol while taking it.  If you are not feeling better in a week please return for reevaluation.  If anything worsens and you have high fever, worsening cough, shortness of breath, chest pain you need to be seen immediately.

## 2023-08-17 NOTE — ED Provider Notes (Signed)
 [facility]    CSN: [id number] Arrival date & time: 08/17/23  1511      History   Chief Complaint Chief Complaint  Patient presents with   Cough   Diarrhea   Nasal Congestion    HPI Elman Dettman is a 63 y.o. male.   Patient presents today with a 2-week history of URI symptoms including cough, congestion, vomiting, diarrhea, postnasal drainage, shortness of breath and a cough.  Denies any chest pain, measured fever.  He has tried multiple over-the-counter medications as well as cinnamon candies that have provided only minimal improvement of symptoms.  Does report he has been exposed to someone who was recently diagnosed with pneumonia and so he is concerned that he could have the same thing.  He does have a history of acute respiratory failure and severe complications related to COVID-19 several years ago.  He reports having asthma as a child but has never had issues with his lungs in adulthood until COVID-19.  He was treated with antibiotics in April 2025 for dental infection (clindamycin ) but denies additional antibiotics or steroids in the past 90 days.  He is having difficulty with sleep as a result of symptoms.    Past Medical History:  Diagnosis Date   Allergy    COVID-19    Depression    Pneumonia     Patient Active Problem List   Diagnosis Date Noted   Acute respiratory failure with hypoxia (HCC) 02/26/2020   Hyperglycemia 02/26/2020   Hypoxia    Diabetes (HCC) 02/05/2020   AKI (acute kidney injury) (HCC) 02/05/2020   Pneumonia due to COVID-19 virus 02/04/2020    History reviewed. No pertinent surgical history.     Home Medications    Prior to Admission medications   Medication Sig Start Date End Date Taking? Authorizing Provider  amoxicillin -clavulanate (AUGMENTIN ) 875-125 MG tablet Take 1 tablet by mouth every 12 (twelve) hours. 08/17/23  Yes Jalyn Dutta K, PA-C  fluticasone  (FLONASE ) 50 MCG/ACT nasal spray Place 1 spray into both nostrils  daily. 08/17/23  Yes Nicasio Barlowe, Rocky POUR, PA-C  promethazine -dextromethorphan  (PROMETHAZINE -DM) 6.25-15 MG/5ML syrup Take 5 mLs by mouth at bedtime as needed for cough. 08/17/23  Yes Nicolas Banh K, PA-C  cyanocobalamin  (VITAMIN B12) 1000 MCG tablet Take 1 tablet (1,000 mcg total) by mouth daily. 06/06/23   Oley Bascom RAMAN, NP    Family History Family History  Problem Relation Age of Onset   Cancer Mother    Hypertension Father    Cancer Father     Social History Social History   Tobacco Use   Smoking status: Never   Smokeless tobacco: Never  Vaping Use   Vaping status: Never Used  Substance Use Topics   Alcohol use: Not Currently   Drug use: Never     Allergies   Percocet [oxycodone -acetaminophen ]   Review of Systems Review of Systems  Constitutional:  Positive for activity change. Negative for appetite change, fatigue and fever.  HENT:  Positive for congestion, postnasal drip and sore throat. Negative for sinus pressure and sneezing.   Respiratory:  Positive for cough and shortness of breath.   Cardiovascular:  Negative for chest pain.  Gastrointestinal:  Positive for diarrhea and vomiting. Negative for abdominal pain and nausea.  Neurological:  Positive for headaches. Negative for dizziness and light-headedness.     Physical Exam Triage Vital Signs ED Triage Vitals  Encounter Vitals Group     BP 08/17/23 1535 (!) 152/85     Girls  Systolic BP Percentile --      Girls Diastolic BP Percentile --      Boys Systolic BP Percentile --      Boys Diastolic BP Percentile --      Pulse Rate 08/17/23 1535 82     Resp 08/17/23 1535 18     Temp 08/17/23 1535 98.2 F (36.8 C)     Temp Source 08/17/23 1535 Oral     SpO2 08/17/23 1535 97 %     Weight --      Height --      Head Circumference --      Peak Flow --      Pain Score 08/17/23 1537 0     Pain Loc --      Pain Education --      Exclude from Growth Chart --    No data found.  Updated Vital Signs BP (!) 152/85  (BP Location: Left Arm)   Pulse 82   Temp 98.2 F (36.8 C) (Oral)   Resp 18   SpO2 97%   Visual Acuity Right Eye Distance:   Left Eye Distance:   Bilateral Distance:    Right Eye Near:   Left Eye Near:    Bilateral Near:     Physical Exam Vitals reviewed.  Constitutional:      General: He is awake.     Appearance: Normal appearance. He is well-developed. He is not ill-appearing.     Comments: Very pleasant male appears stated age in no acute distress sitting comfortably in exam room  HENT:     Head: Normocephalic and atraumatic.     Right Ear: Tympanic membrane, ear canal and external ear normal. Tympanic membrane is not erythematous or bulging.     Left Ear: Tympanic membrane, ear canal and external ear normal. Tympanic membrane is not erythematous or bulging.     Nose: Nose normal.     Mouth/Throat:     Pharynx: Uvula midline. Posterior oropharyngeal erythema and postnasal drip present. No oropharyngeal exudate or uvula swelling.  Cardiovascular:     Rate and Rhythm: Normal rate and regular rhythm.     Heart sounds: Normal heart sounds, S1 normal and S2 normal. No murmur heard. Pulmonary:     Effort: Pulmonary effort is normal. No accessory muscle usage or respiratory distress.     Breath sounds: No stridor. Wheezing present. No rhonchi or rales.     Comments: Scattered wheezing.  Reactive cough with deep breathing Abdominal:     General: Bowel sounds are normal.     Palpations: Abdomen is soft.     Tenderness: There is no abdominal tenderness.  Neurological:     Mental Status: He is alert.  Psychiatric:        Behavior: Behavior is cooperative.      UC Treatments / Results  Labs (all labs ordered are listed, but only abnormal results are displayed) Labs Reviewed - No data to display  EKG   Radiology DG Chest 2 View Result Date: 08/17/2023 CLINICAL DATA:  Worsening cough EXAM: CHEST - 2 VIEW COMPARISON:  Chest x-ray May 05, 2021 FINDINGS: The heart size  and mediastinal contours are within normal limits. Mild increased interstitial markings of lung fields otherwise no suspicious consolidation or nodule. No pleural effusion or pneumothorax. The visualized skeletal structures are unremarkable. IMPRESSION: Mild increased interstitial marking of both lung fields. No consolidation. Electronically Signed   By: Megan  Zare M.D.   On: 08/17/2023 16:52  Procedures Procedures (including critical care time)  Medications Ordered in UC Medications - No data to display  Initial Impression / Assessment and Plan / UC Course  I have reviewed the triage vital signs and the nursing notes.  Pertinent labs & imaging results that were available during my care of the patient were reviewed by me and considered in my medical decision making (see chart for details).     Patient is well-appearing, afebrile, nontoxic, nontachycardic.  Viral testing was deferred as patient has been symptomatic for several weeks and this would not change management.  Chest x-ray was obtained that showed no acute cardiopulmonary disease.  Concern for secondary bacterial infection given prolonged and worsening congestion and cough symptoms.  Will cover with Augmentin  twice daily for 7 days.  No indication for dose adjustment based on metabolic panel from 06/03/2023 with creatinine of 0.91 and calculated creatinine clearance of 114 mL/min.  He was given fluticasone  nasal spray to help with congestion as well as encouraged use over-the-counter medication such as Mucinex  and Tylenol .  He was encouraged to use nasal saline sinus rinses for additional symptom relief.  He was given Promethazine  DM for cough.  We discussed that this can be sedating and he is not to drive or drink alcohol with taking it.  If he is not feeling better in a week he is to return for reevaluation.  Discussed that if he has any worsening or changing symptoms he needs to be seen immediately.  Strict return precautions given.  All  questions answered to patient satisfaction.  Final Clinical Impressions(s) / UC Diagnoses   Final diagnoses:  Acute cough  Sinobronchitis     Discharge Instructions      Your x-ray did not show any evidence of pneumonia which is great news.  I am concerned that you have a bacterial infection in your sinuses that is causing inflammation in your airway.  Start Augmentin  twice daily for 7 days.  Use fluticasone  nasal spray as well as over-the-counter medicine such as Mucinex .  I recommend nasal saline and sinus rinses.  Use Promethazine  DM for cough.  This will make you sleepy so do not drive or drink alcohol while taking it.  If you are not feeling better in a week please return for reevaluation.  If anything worsens and you have high fever, worsening cough, shortness of breath, chest pain you need to be seen immediately.     ED Prescriptions     Medication Sig Dispense Auth. Provider   amoxicillin -clavulanate (AUGMENTIN ) 875-125 MG tablet Take 1 tablet by mouth every 12 (twelve) hours. 14 tablet Thierno Hun K, PA-C   fluticasone  (FLONASE ) 50 MCG/ACT nasal spray Place 1 spray into both nostrils daily. 16 g Waymon Laser K, PA-C   promethazine -dextromethorphan  (PROMETHAZINE -DM) 6.25-15 MG/5ML syrup Take 5 mLs by mouth at bedtime as needed for cough. 50 mL Deen Deguia K, PA-C      PDMP not reviewed this encounter.   Sherrell Rocky POUR, PA-C 08/17/23 1726

## 2023-08-18 ENCOUNTER — Other Ambulatory Visit: Payer: Self-pay

## 2023-08-18 MED ORDER — VITAMIN B-12 1000 MCG PO TABS
1000.0000 ug | ORAL_TABLET | Freq: Every day | ORAL | 3 refills | Status: AC
  Filled 2023-08-18: qty 30, 30d supply, fill #0
  Filled 2023-10-18: qty 30, 30d supply, fill #1

## 2023-08-19 ENCOUNTER — Other Ambulatory Visit: Payer: Self-pay

## 2023-08-25 ENCOUNTER — Ambulatory Visit: Admitting: Podiatry

## 2023-08-25 ENCOUNTER — Other Ambulatory Visit: Payer: Self-pay

## 2023-08-28 ENCOUNTER — Emergency Department (HOSPITAL_BASED_OUTPATIENT_CLINIC_OR_DEPARTMENT_OTHER)

## 2023-08-28 ENCOUNTER — Encounter (HOSPITAL_BASED_OUTPATIENT_CLINIC_OR_DEPARTMENT_OTHER): Payer: Self-pay

## 2023-08-28 ENCOUNTER — Inpatient Hospital Stay (HOSPITAL_BASED_OUTPATIENT_CLINIC_OR_DEPARTMENT_OTHER)
Admission: EM | Admit: 2023-08-28 | Discharge: 2023-08-31 | DRG: 392 | Disposition: A | Discharge status: Home / Self Care | Attending: Internal Medicine | Admitting: Internal Medicine

## 2023-08-28 ENCOUNTER — Other Ambulatory Visit: Payer: Self-pay

## 2023-08-28 DIAGNOSIS — Z885 Allergy status to narcotic agent status: Secondary | ICD-10-CM

## 2023-08-28 DIAGNOSIS — K529 Noninfective gastroenteritis and colitis, unspecified: Principal | ICD-10-CM | POA: Diagnosis present

## 2023-08-28 DIAGNOSIS — Z8249 Family history of ischemic heart disease and other diseases of the circulatory system: Secondary | ICD-10-CM

## 2023-08-28 DIAGNOSIS — I10 Essential (primary) hypertension: Secondary | ICD-10-CM | POA: Diagnosis present

## 2023-08-28 DIAGNOSIS — Z8701 Personal history of pneumonia (recurrent): Secondary | ICD-10-CM

## 2023-08-28 DIAGNOSIS — E1165 Type 2 diabetes mellitus with hyperglycemia: Secondary | ICD-10-CM | POA: Diagnosis present

## 2023-08-28 DIAGNOSIS — K922 Gastrointestinal hemorrhage, unspecified: Principal | ICD-10-CM

## 2023-08-28 DIAGNOSIS — Z8616 Personal history of COVID-19: Secondary | ICD-10-CM

## 2023-08-28 DIAGNOSIS — R197 Diarrhea, unspecified: Secondary | ICD-10-CM

## 2023-08-28 DIAGNOSIS — R1110 Vomiting, unspecified: Secondary | ICD-10-CM

## 2023-08-28 DIAGNOSIS — K625 Hemorrhage of anus and rectum: Secondary | ICD-10-CM | POA: Insufficient documentation

## 2023-08-28 DIAGNOSIS — E876 Hypokalemia: Secondary | ICD-10-CM | POA: Diagnosis not present

## 2023-08-28 DIAGNOSIS — R1084 Generalized abdominal pain: Secondary | ICD-10-CM

## 2023-08-28 DIAGNOSIS — F32A Depression, unspecified: Secondary | ICD-10-CM | POA: Diagnosis present

## 2023-08-28 LAB — CBC WITH DIFFERENTIAL/PLATELET
Abs Immature Granulocytes: 0.05 K/uL (ref 0.00–0.07)
Basophils Absolute: 0 K/uL (ref 0.0–0.1)
Basophils Relative: 0 %
Eosinophils Absolute: 0 K/uL (ref 0.0–0.5)
Eosinophils Relative: 0 %
HCT: 51.4 % (ref 39.0–52.0)
Hemoglobin: 18.1 g/dL — ABNORMAL HIGH (ref 13.0–17.0)
Immature Granulocytes: 0 %
Lymphocytes Relative: 8 %
Lymphs Abs: 1.1 K/uL (ref 0.7–4.0)
MCH: 30.4 pg (ref 26.0–34.0)
MCHC: 35.2 g/dL (ref 30.0–36.0)
MCV: 86.2 fL (ref 80.0–100.0)
Monocytes Absolute: 0.7 K/uL (ref 0.1–1.0)
Monocytes Relative: 5 %
Neutro Abs: 10.9 K/uL — ABNORMAL HIGH (ref 1.7–7.7)
Neutrophils Relative %: 87 %
Platelets: 193 K/uL (ref 150–400)
RBC: 5.96 MIL/uL — ABNORMAL HIGH (ref 4.22–5.81)
RDW: 12.9 % (ref 11.5–15.5)
WBC: 12.7 K/uL — ABNORMAL HIGH (ref 4.0–10.5)
nRBC: 0 % (ref 0.0–0.2)

## 2023-08-28 LAB — COMPREHENSIVE METABOLIC PANEL WITH GFR
ALT: 33 U/L (ref 0–44)
AST: 18 U/L (ref 15–41)
Albumin: 4.6 g/dL (ref 3.5–5.0)
Alkaline Phosphatase: 98 U/L (ref 38–126)
Anion gap: 13 (ref 5–15)
BUN: 9 mg/dL (ref 8–23)
CO2: 26 mmol/L (ref 22–32)
Calcium: 10.1 mg/dL (ref 8.9–10.3)
Chloride: 99 mmol/L (ref 98–111)
Creatinine, Ser: 0.85 mg/dL (ref 0.61–1.24)
GFR, Estimated: >60 mL/min (ref >60)
Glucose, Bld: 282 mg/dL — ABNORMAL HIGH (ref 70–99)
Potassium: 4.6 mmol/L (ref 3.5–5.1)
Sodium: 137 mmol/L (ref 135–145)
Total Bilirubin: 2.4 mg/dL — ABNORMAL HIGH (ref 0.0–1.2)
Total Protein: 8.6 g/dL — ABNORMAL HIGH (ref 6.5–8.1)

## 2023-08-28 LAB — OCCULT BLOOD X 1 CARD TO LAB, STOOL: Fecal Occult Bld: POSITIVE — AB

## 2023-08-28 LAB — LIPASE, BLOOD: Lipase: 16 U/L (ref 11–51)

## 2023-08-28 MED ORDER — SODIUM CHLORIDE 0.9 % IV BOLUS
500.0000 mL | Freq: Once | INTRAVENOUS | Status: AC
  Administered 2023-08-28: 500 mL via INTRAVENOUS

## 2023-08-28 MED ORDER — IOHEXOL 300 MG/ML  SOLN
100.0000 mL | Freq: Once | INTRAMUSCULAR | Status: AC | PRN
  Administered 2023-08-28: 100 mL via INTRAVENOUS

## 2023-08-28 MED ORDER — SODIUM CHLORIDE 0.9 % IV SOLN: INTRAVENOUS | Status: DC

## 2023-08-28 MED ORDER — ONDANSETRON HCL 4 MG/2ML IJ SOLN
4.0000 mg | Freq: Once | INTRAMUSCULAR | Status: AC
  Administered 2023-08-28: 4 mg via INTRAVENOUS
  Filled 2023-08-28: qty 2

## 2023-08-28 NOTE — ED Provider Notes (Addendum)
 [place] EMERGENCY DEPARTMENT AT [facility] Provider Note   CSN: [id number] Arrival date & time: 08/28/23  1935     Patient presents with: Abdominal Pain   Raymond Lewis is a 63 y.o. male.   Patient yesterday started with vomiting and diarrhea.  She had red blood per rectum no blood in the vomit.  Has had bowel movements quite frequently since yesterday.  Has not recently been on antibiotics about maybe was on 2 weeks ago.  Associated with diffuse abdominal pain.  Says weight loss loss of appetite as well.  Patient had felt he had been constipated and dehydrated.  Past medical history noncontributory.  Patient seen recently for dental abscesses.  Patient denies any fevers.       Prior to Admission medications   Medication Sig Start Date End Date Taking? Authorizing Provider  amoxicillin -clavulanate (AUGMENTIN ) 875-125 MG tablet Take 1 tablet by mouth every 12 (twelve) hours. 08/17/23   Raspet, Erin K, PA-C  cyanocobalamin  (VITAMIN B12) 1000 MCG tablet Take 1 tablet (1,000 mcg total) by mouth daily. 06/06/23   Oley Bascom RAMAN, NP  cyanocobalamin  (VITAMIN B12) 1000 MCG tablet Take 1 tablet (1,000 mcg total) by mouth daily. 06/06/23   Nichols, Tonya S, NP  fluticasone  (FLONASE ) 50 MCG/ACT nasal spray Place 1 spray into both nostrils daily. 08/17/23   Raspet, Erin K, PA-C  promethazine -dextromethorphan  (PROMETHAZINE -DM) 6.25-15 MG/5ML syrup Take 5 mLs by mouth at bedtime as needed for cough. 08/17/23   Raspet, Erin K, PA-C    Allergies: Percocet [oxycodone -acetaminophen ]    Review of Systems  Constitutional:  Negative for chills and fever.  HENT:  Negative for ear pain and sore throat.   Eyes:  Negative for pain and visual disturbance.  Respiratory:  Negative for cough and shortness of breath.   Cardiovascular:  Negative for chest pain and palpitations.  Gastrointestinal:  Positive for abdominal pain, blood in stool, diarrhea, nausea and vomiting.  Genitourinary:  Negative for  dysuria and hematuria.  Musculoskeletal:  Negative for arthralgias and back pain.  Skin:  Negative for color change and rash.  Neurological:  Negative for seizures and syncope.  All other systems reviewed and are negative.   Updated Vital Signs BP (!) 180/98   Pulse 90   Temp 98.6 F (37 C) (Oral)   Resp 18   Ht 1.867 m (6' 1.5)   Wt 91.2 kg   SpO2 100%   BMI 26.16 kg/m   Physical Exam Vitals and nursing note reviewed.  Constitutional:      General: He is not in acute distress.    Appearance: Normal appearance. He is well-developed.  HENT:     Head: Normocephalic and atraumatic.     Mouth/Throat:     Mouth: Mucous membranes are moist.  Eyes:     Extraocular Movements: Extraocular movements intact.     Conjunctiva/sclera: Conjunctivae normal.     Pupils: Pupils are equal, round, and reactive to light.  Cardiovascular:     Rate and Rhythm: Normal rate and regular rhythm.     Heart sounds: No murmur heard. Pulmonary:     Effort: Pulmonary effort is normal. No respiratory distress.     Breath sounds: Normal breath sounds.  Abdominal:     General: There is no distension.     Palpations: Abdomen is soft.     Tenderness: There is no abdominal tenderness. There is no guarding.  Genitourinary:    Rectum: Guaiac result positive.  Musculoskeletal:  General: No swelling.     Cervical back: Normal range of motion and neck supple. No rigidity.     Right lower leg: No edema.     Left lower leg: No edema.  Skin:    General: Skin is warm and dry.     Capillary Refill: Capillary refill takes less than 2 seconds.  Neurological:     General: No focal deficit present.     Mental Status: He is alert and oriented to person, place, and time.  Psychiatric:        Mood and Affect: Mood normal.     (all labs ordered are listed, but only abnormal results are displayed) Labs Reviewed  CBC WITH DIFFERENTIAL/PLATELET  COMPREHENSIVE METABOLIC PANEL WITH GFR  LIPASE, BLOOD   OCCULT BLOOD X 1 CARD TO LAB, STOOL    EKG: None  Radiology: No results found.   Procedures   Medications Ordered in the ED  sodium chloride  0.9 % bolus 500 mL (has no administration in time range)  0.9 %  sodium chloride  infusion (has no administration in time range)  ondansetron  (ZOFRAN ) injection 4 mg (has no administration in time range)                                    Medical Decision Making Amount and/or Complexity of Data Reviewed Labs: ordered. Radiology: ordered.  Risk Prescription drug management.   Patient rectal area with some gross blood.  Waiting for official Hemoccult.  But should be positive.  Will get CBC complete metabolic panel lipase CT scan abdomen pelvis.  Will give some antinausea medicine and will give some IV fluids.  EKG is sinus rhythm heart rate of 63.  No acute abnormalities.  It did not crossover into the system   Based on the frequency of the blood although not standing tall water all red.  Has been occurring quite frequently since yesterday.  CT scan of the abdomen pelvis is pending.SABRA  CBC white count 12.7 hemoglobin 18.1 platelets 193 so somewhat hemoconcentrated.  Complete metabolic panel pending lipase pending.  Hemoccult was positive as suspected.  Patient receiving IV fluids.  Awaiting labs and CT scan of the abdomen pelvis.  Patient's complete metabolic panel total bili up at 2.4.  LFTs otherwise normal GFR greater than 60 blood sugar up some at 282 but CO2 is 26 electrolytes normal.  Lipase 16.  CT scan abdomen pelvis pending.  Final diagnoses:  Gastrointestinal hemorrhage, unspecified gastrointestinal hemorrhage type  Generalized abdominal pain  Vomiting and diarrhea    ED Discharge Orders     None          Geraldene Hamilton, MD 08/28/23 2122    Geraldene Hamilton, MD 08/28/23 7766    Geraldene Hamilton, MD 08/28/23 7755    Geraldene Hamilton, MD 08/28/23 2259

## 2023-08-28 NOTE — ED Provider Notes (Signed)
 11:04 PM Assumed care from Dr. Zackowski, please see their note for full history, physical and decision making until this point. In brief this is a 63 y.o. year old male who presented to the ED tonight with Abdominal Pain     Increased stools and vomiting. Some blood streaking. Stable. Pending ct for disposition. Recently on abx.  Discharge instructions, including strict return precautions for new or worsening symptoms, given. Patient and/or family verbalized understanding and agreement with the plan as described.   Labs, studies and imaging reviewed by myself and considered in medical decision making if ordered. Imaging interpreted by radiology.  Labs Reviewed  CBC WITH DIFFERENTIAL/PLATELET - Abnormal; Notable for the following components:      Result Value   WBC 12.7 (*)    RBC 5.96 (*)    Hemoglobin 18.1 (*)    Neutro Abs 10.9 (*)    All other components within normal limits  COMPREHENSIVE METABOLIC PANEL WITH GFR - Abnormal; Notable for the following components:   Glucose, Bld 282 (*)    Total Protein 8.6 (*)    Total Bilirubin 2.4 (*)    All other components within normal limits  OCCULT BLOOD X 1 CARD TO LAB, STOOL - Abnormal; Notable for the following components:   Fecal Occult Bld POSITIVE (*)    All other components within normal limits  LIPASE, BLOOD    CT ABDOMEN PELVIS W CONTRAST    (Results Pending)    No follow-ups on file.

## 2023-08-28 NOTE — ED Triage Notes (Addendum)
 Abdominal pain since yesterday Claims he has had bright red bleeding with Bms Weight loss and loss of appetite States he is constipated & dehydrated Pt had to leave triage room x 2 to have a BM & throw up 1st time no BM only bright red blood and small amt of vomit

## 2023-08-28 NOTE — ED Notes (Signed)
 Bedside commode was placed at bedside for pt. Pt stated that's not going to work, and prefers to get up and physically walk to the bathroom.

## 2023-08-28 NOTE — ED Notes (Signed)
 EKG obtained. Patient repeatedly going to restroom.  Unable to start IV or give medications because patient cannot stay in the bed long enough to complete.

## 2023-08-29 ENCOUNTER — Inpatient Hospital Stay (HOSPITAL_COMMUNITY)

## 2023-08-29 DIAGNOSIS — I10 Essential (primary) hypertension: Secondary | ICD-10-CM | POA: Diagnosis present

## 2023-08-29 DIAGNOSIS — Z885 Allergy status to narcotic agent status: Secondary | ICD-10-CM | POA: Diagnosis not present

## 2023-08-29 DIAGNOSIS — Z8616 Personal history of COVID-19: Secondary | ICD-10-CM | POA: Diagnosis not present

## 2023-08-29 DIAGNOSIS — E876 Hypokalemia: Secondary | ICD-10-CM | POA: Diagnosis not present

## 2023-08-29 DIAGNOSIS — Z8249 Family history of ischemic heart disease and other diseases of the circulatory system: Secondary | ICD-10-CM | POA: Diagnosis not present

## 2023-08-29 DIAGNOSIS — K922 Gastrointestinal hemorrhage, unspecified: Secondary | ICD-10-CM

## 2023-08-29 DIAGNOSIS — K625 Hemorrhage of anus and rectum: Secondary | ICD-10-CM | POA: Diagnosis not present

## 2023-08-29 DIAGNOSIS — F32A Depression, unspecified: Secondary | ICD-10-CM | POA: Diagnosis present

## 2023-08-29 DIAGNOSIS — Z8701 Personal history of pneumonia (recurrent): Secondary | ICD-10-CM | POA: Diagnosis not present

## 2023-08-29 DIAGNOSIS — E1165 Type 2 diabetes mellitus with hyperglycemia: Secondary | ICD-10-CM | POA: Diagnosis present

## 2023-08-29 DIAGNOSIS — R1084 Generalized abdominal pain: Secondary | ICD-10-CM | POA: Diagnosis present

## 2023-08-29 DIAGNOSIS — K529 Noninfective gastroenteritis and colitis, unspecified: Secondary | ICD-10-CM | POA: Diagnosis not present

## 2023-08-29 LAB — HEMOGLOBIN AND HEMATOCRIT, BLOOD
HCT: 49.2 % (ref 39.0–52.0)
Hemoglobin: 17.2 g/dL — ABNORMAL HIGH (ref 13.0–17.0)

## 2023-08-29 LAB — CBC
HCT: 48.7 % (ref 39.0–52.0)
Hemoglobin: 17 g/dL (ref 13.0–17.0)
MCH: 30.9 pg (ref 26.0–34.0)
MCHC: 34.9 g/dL (ref 30.0–36.0)
MCV: 88.4 fL (ref 80.0–100.0)
Platelets: 180 K/uL (ref 150–400)
RBC: 5.51 MIL/uL (ref 4.22–5.81)
RDW: 13.1 % (ref 11.5–15.5)
WBC: 12.2 K/uL — ABNORMAL HIGH (ref 4.0–10.5)
nRBC: 0 % (ref 0.0–0.2)

## 2023-08-29 LAB — GLUCOSE, CAPILLARY
Glucose-Capillary: 255 mg/dL — ABNORMAL HIGH (ref 70–99)
Glucose-Capillary: 230 mg/dL — ABNORMAL HIGH (ref 70–99)
Glucose-Capillary: 235 mg/dL — ABNORMAL HIGH (ref 70–99)

## 2023-08-29 LAB — CREATININE, SERUM
Creatinine, Ser: 0.54 mg/dL — ABNORMAL LOW (ref 0.61–1.24)
GFR, Estimated: >60 mL/min (ref >60)

## 2023-08-29 LAB — HEMOGLOBIN A1C
Hgb A1c MFr Bld: 9.5 % — ABNORMAL HIGH (ref 4.8–5.6)
Mean Plasma Glucose: 225.95 mg/dL

## 2023-08-29 LAB — C-REACTIVE PROTEIN: CRP: 0.6 mg/dL (ref <1.0)

## 2023-08-29 LAB — HIV ANTIBODY (ROUTINE TESTING W REFLEX): HIV Screen 4th Generation wRfx: NONREACTIVE

## 2023-08-29 LAB — C DIFFICILE QUICK SCREEN W PCR REFLEX
C Diff antigen: NEGATIVE
C Diff interpretation: NOT DETECTED
C Diff toxin: NEGATIVE

## 2023-08-29 LAB — TYPE AND SCREEN
ABO/RH(D): B POS
Antibody Screen: NEGATIVE

## 2023-08-29 LAB — ABO/RH: ABO/RH(D): B POS

## 2023-08-29 MED ORDER — METRONIDAZOLE 500 MG/100ML IV SOLN
500.0000 mg | Freq: Two times a day (BID) | INTRAVENOUS | Status: DC
  Administered 2023-08-29 – 2023-08-31 (×5): 500 mg via INTRAVENOUS
  Filled 2023-08-29 (×5): qty 100

## 2023-08-29 MED ORDER — CIPROFLOXACIN IN D5W 400 MG/200ML IV SOLN
400.0000 mg | Freq: Two times a day (BID) | INTRAVENOUS | Status: DC
  Administered 2023-08-29 – 2023-08-31 (×4): 400 mg via INTRAVENOUS
  Filled 2023-08-29 (×4): qty 200

## 2023-08-29 MED ORDER — LACTATED RINGERS IV BOLUS
1000.0000 mL | Freq: Once | INTRAVENOUS | Status: AC
  Administered 2023-08-29: 1000 mL via INTRAVENOUS

## 2023-08-29 MED ORDER — HYDROMORPHONE HCL 1 MG/ML IJ SOLN
1.0000 mg | Freq: Once | INTRAMUSCULAR | Status: AC
  Administered 2023-08-29: 1 mg via INTRAVENOUS
  Filled 2023-08-29: qty 1

## 2023-08-29 MED ORDER — ONDANSETRON HCL 4 MG/2ML IJ SOLN: 4.0000 mg | Freq: Four times a day (QID) | INTRAMUSCULAR | Status: DC | PRN

## 2023-08-29 MED ORDER — INSULIN ASPART 100 UNIT/ML IJ SOLN: 0.0000 [IU] | Freq: Four times a day (QID) | INTRAMUSCULAR | Status: DC

## 2023-08-29 MED ORDER — PANTOPRAZOLE SODIUM 40 MG IV SOLR
40.0000 mg | Freq: Two times a day (BID) | INTRAVENOUS | Status: DC
  Administered 2023-08-29 – 2023-08-31 (×5): 40 mg via INTRAVENOUS
  Filled 2023-08-29 (×5): qty 10

## 2023-08-29 MED ORDER — ALBUTEROL SULFATE (2.5 MG/3ML) 0.083% IN NEBU: 2.5000 mg | INHALATION_SOLUTION | RESPIRATORY_TRACT | Status: DC | PRN

## 2023-08-29 MED ORDER — LACTATED RINGERS IV SOLN: INTRAVENOUS | Status: DC

## 2023-08-29 MED ORDER — HEPARIN SODIUM (PORCINE) 5000 UNIT/ML IJ SOLN: 5000.0000 [IU] | Freq: Three times a day (TID) | INTRAMUSCULAR | Status: DC

## 2023-08-29 MED ORDER — IOHEXOL 350 MG/ML SOLN
100.0000 mL | Freq: Once | INTRAVENOUS | Status: AC | PRN
  Administered 2023-08-29: 100 mL via INTRAVENOUS

## 2023-08-29 MED ORDER — CIPROFLOXACIN IN D5W 400 MG/200ML IV SOLN
400.0000 mg | Freq: Once | INTRAVENOUS | Status: AC
  Administered 2023-08-29: 400 mg via INTRAVENOUS
  Filled 2023-08-29: qty 200

## 2023-08-29 MED ORDER — ONDANSETRON HCL 4 MG PO TABS: 4.0000 mg | ORAL_TABLET | Freq: Four times a day (QID) | ORAL | Status: DC | PRN

## 2023-08-29 MED ORDER — ONDANSETRON HCL 4 MG/2ML IJ SOLN
4.0000 mg | Freq: Once | INTRAMUSCULAR | Status: AC
  Administered 2023-08-29: 4 mg via INTRAVENOUS
  Filled 2023-08-29: qty 2

## 2023-08-29 MED ORDER — METRONIDAZOLE 500 MG/100ML IV SOLN
500.0000 mg | Freq: Once | INTRAVENOUS | Status: AC
  Administered 2023-08-29: 500 mg via INTRAVENOUS
  Filled 2023-08-29: qty 100

## 2023-08-29 MED ORDER — FENTANYL CITRATE PF 50 MCG/ML IJ SOSY: 12.5000 ug | PREFILLED_SYRINGE | INTRAMUSCULAR | Status: DC | PRN

## 2023-08-29 NOTE — Plan of Care (Signed)

## 2023-08-29 NOTE — Consult Note (Signed)
 [facility] Gastroenterology Consult  Referring Provider: No ref. provider found Primary Care Physician:  Oley Bascom RAMAN, NP Primary Gastroenterologist: [place]   Reason for Consultation: Colitis  SUBJECTIVE:   HPI: Raford Brissett is a 63 y.o. male with past medical history significant for depression, COVID-19. Noted that he had a persistent chest cold in recent weeks for which he took antibiotic therapy. Shortly thereafter he began to have frequent, small volume diarrhea. He began to have small volume blood per rectum associated with the diarrhea. He was having nausea and emesis. He has lost weight. He denied previous colonoscopy. No family history colon cancer.   Per review of record, was seen at urgent care on 08/17/23 and was prescribed 7-day course of Augmentin .  Past Medical History:  Diagnosis Date   Allergy    COVID-19    Depression    Pneumonia    History reviewed. No pertinent surgical history. Prior to Admission medications   Medication Sig Start Date End Date Taking? Authorizing Provider  amoxicillin -clavulanate (AUGMENTIN ) 875-125 MG tablet Take 1 tablet by mouth every 12 (twelve) hours. 08/17/23   Raspet, Erin K, PA-C  cyanocobalamin  (VITAMIN B12) 1000 MCG tablet Take 1 tablet (1,000 mcg total) by mouth daily. 06/06/23   Nichols, Tonya S, NP  fluticasone  (FLONASE ) 50 MCG/ACT nasal spray Place 1 spray into both nostrils daily. 08/17/23   Raspet, Erin K, PA-C  promethazine -dextromethorphan  (PROMETHAZINE -DM) 6.25-15 MG/5ML syrup Take 5 mLs by mouth at bedtime as needed for cough. 08/17/23   Raspet, Rocky POUR, PA-C   Current Facility-Administered Medications  Medication Dose Route Frequency Provider Last Rate Last Admin   0.9 %  sodium chloride  infusion   Intravenous Continuous Zackowski, Scott, MD   Stopped at 08/29/23 0239   albuterol  (PROVENTIL ) (2.5 MG/3ML) 0.083% nebulizer solution 2.5 mg  2.5 mg Nebulization Q2H PRN Debby Camila LABOR, MD       ciprofloxacin  (CIPRO ) IVPB 400  mg  400 mg Intravenous Q12H Debby Camila A, MD 200 mL/hr at 08/29/23 1515 Infusion Verify at 08/29/23 1515   fentaNYL  (SUBLIMAZE ) injection 12.5-50 mcg  12.5-50 mcg Intravenous Q2H PRN Thomas, Sara-Maiz A, MD       insulin  aspart (novoLOG ) injection 0-9 Units  0-9 Units Subcutaneous Q6H Sreeram, Narendranath, MD   3 Units at 08/29/23 1227   metroNIDAZOLE  (FLAGYL ) IVPB 500 mg  500 mg Intravenous Q12H Debby Camila A, MD 100 mL/hr at 08/29/23 1515 Infusion Verify at 08/29/23 1515   ondansetron  (ZOFRAN ) tablet 4 mg  4 mg Oral Q6H PRN Debby Camila LABOR, MD       Or   ondansetron  (ZOFRAN ) injection 4 mg  4 mg Intravenous Q6H PRN Debby Camila LABOR, MD       pantoprazole  (PROTONIX ) injection 40 mg  40 mg Intravenous Q12H Debby Camila LABOR, MD   40 mg at 08/29/23 9070   Allergies as of 08/28/2023 - Review Complete 08/28/2023  Allergen Reaction Noted   Percocet [oxycodone -acetaminophen ] Other (See Comments) 05/18/2023   Family History  Problem Relation Age of Onset   Cancer Mother    Hypertension Father    Cancer Father    Social History   Socioeconomic History   Marital status: Married    Spouse name: Not on file   Number of children: Not on file   Years of education: Not on file   Highest education level: Not on file  Occupational History   Not on file  Tobacco Use   Smoking status: Never   Smokeless tobacco:  Never  Vaping Use   Vaping status: Never Used  Substance and Sexual Activity   Alcohol use: Not Currently   Drug use: Never   Sexual activity: Yes  Other Topics Concern   Not on file  Social History Narrative   Not on file   Social Drivers of Health   Financial Resource Strain: Not on file  Food Insecurity: No Food Insecurity (08/29/2023)   Hunger Vital Sign    Worried About Running Out of Food in the Last Year: Never true    Ran Out of Food in the Last Year: Never true  Transportation Needs: No Transportation Needs (08/29/2023)   PRAPARE - Therapist, art (Medical): No    Lack of Transportation (Non-Medical): No  Physical Activity: Not on file  Stress: Not on file  Social Connections: Not on file  Intimate Partner Violence: Not At Risk (08/29/2023)   Humiliation, Afraid, Rape, and Kick questionnaire    Fear of Current or Ex-Partner: No    Emotionally Abused: No    Physically Abused: No    Sexually Abused: No   Review of Systems:  Review of Systems  Respiratory:  Negative for shortness of breath.   Cardiovascular:  Negative for chest pain.  Gastrointestinal:  Positive for abdominal pain, blood in stool, diarrhea, nausea and vomiting.    OBJECTIVE:   Temp:  [98.3 F (36.8 C)-98.6 F (37 C)] 98.3 F (36.8 C) (07/28 1209) Pulse Rate:  [66-90] 69 (07/28 1209) Resp:  [12-19] 18 (07/28 1209) BP: (152-186)/(82-98) 162/87 (07/28 1209) SpO2:  [91 %-100 %] 99 % (07/28 1209) Weight:  [91.2 kg] 91.2 kg (07/27 2005)   Physical Exam Constitutional:      General: He is not in acute distress.    Appearance: He is not ill-appearing, toxic-appearing or diaphoretic.  Cardiovascular:     Rate and Rhythm: Normal rate and regular rhythm.  Pulmonary:     Effort: No respiratory distress.     Breath sounds: Normal breath sounds.  Abdominal:     General: Bowel sounds are normal. There is no distension.     Palpations: Abdomen is soft.     Tenderness: There is no abdominal tenderness. There is no guarding.     Labs: Recent Labs    08/28/23 2111 08/29/23 0428 08/29/23 1236  WBC 12.7* 12.2*  --   HGB 18.1* 17.0 17.2*  HCT 51.4 48.7 49.2  PLT 193 180  --    BMET Recent Labs    08/28/23 2111 08/29/23 0428  NA 137  --   K 4.6  --   CL 99  --   CO2 26  --   GLUCOSE 282*  --   BUN 9  --   CREATININE 0.85 0.54*  CALCIUM 10.1  --    LFT Recent Labs    08/28/23 2111  PROT 8.6*  ALBUMIN 4.6  AST 18  ALT 33  ALKPHOS 98  BILITOT 2.4*   PT/INR No results for input(s): LABPROT, INR in the last 72  hours.  Diagnostic imaging: CT ANGIO GI BLEED Result Date: 08/29/2023 CLINICAL DATA:  Nausea/vomiting/diarrhea, abdominal pain, bright red blood per rectum EXAM: CTA ABDOMEN AND PELVIS WITHOUT AND WITH CONTRAST TECHNIQUE: Multidetector CT imaging of the abdomen and pelvis was performed using the standard protocol during bolus administration of intravenous contrast. Multiplanar reconstructed images and MIPs were obtained and reviewed to evaluate the vascular anatomy. RADIATION DOSE REDUCTION: This exam was performed according to  the departmental dose-optimization program which includes automated exposure control, adjustment of the mA and/or kV according to patient size and/or use of iterative reconstruction technique. CONTRAST:  OMNIPAQUE  IOHEXOL  350 MG/ML SOLN COMPARISON:  08/28/2023 FINDINGS: VASCULAR Aorta: Normal caliber aorta without aneurysm, dissection, vasculitis or significant stenosis. Celiac: Patent without evidence of aneurysm, dissection, vasculitis or significant stenosis. SMA: Patent without evidence of aneurysm, dissection, vasculitis or significant stenosis. Renals: Both renal arteries are patent without evidence of aneurysm, dissection, vasculitis, fibromuscular dysplasia or significant stenosis. IMA: Patent without evidence of aneurysm, dissection, vasculitis or significant stenosis. Inflow: Patent without evidence of aneurysm, dissection, vasculitis or significant stenosis. Proximal Outflow: Bilateral common femoral and visualized portions of the superficial and profunda femoral arteries are patent without evidence of aneurysm, dissection, vasculitis or significant stenosis. Veins: No obvious venous abnormality within the limitations of this arterial phase study. Review of the MIP images confirms the above findings. NON-VASCULAR Lower chest: No acute pleural or parenchymal lung disease. Hepatobiliary: No focal liver abnormality is seen. No gallstones, gallbladder wall thickening, or  biliary dilatation. Pancreas: Unremarkable. No pancreatic ductal dilatation or surrounding inflammatory changes. Spleen: Normal in size without focal abnormality. Adrenals/Urinary Tract: Adrenal glands are unremarkable. Kidneys are normal, without renal calculi, focal lesion, or hydronephrosis. Bladder is unremarkable. Stomach/Bowel: Diffuse colonic wall thickening is again noted, unchanged, compatible with inflammatory or infectious colitis. There is no intraluminal accumulation of contrast to suggest active gastrointestinal hemorrhage. No bowel obstruction or ileus. Normal appendix right lower quadrant. Lymphatic: No pathologic adenopathy. Reproductive: Prostate is unremarkable. Other: Trace pelvic free fluid is likely reactive. No free intraperitoneal gas. No abdominal wall hernia. Musculoskeletal: No acute or destructive bony abnormalities. Reconstructed images demonstrate no additional findings. IMPRESSION: VASCULAR 1. No evidence of active gastrointestinal hemorrhage. 2. No significant vascular finding. NON-VASCULAR 1. Stable diffuse colonic wall thickening compatible with inflammatory or infectious colitis. 2. Trace pelvic free fluid, likely reactive. Electronically Signed   By: Ozell Daring M.D.   On: 08/29/2023 14:39   CT ABDOMEN PELVIS W CONTRAST Result Date: 08/28/2023 CLINICAL DATA:  Abdominal pain, rectal bleeding EXAM: CT ABDOMEN AND PELVIS WITH CONTRAST TECHNIQUE: Multidetector CT imaging of the abdomen and pelvis was performed using the standard protocol following bolus administration of intravenous contrast. RADIATION DOSE REDUCTION: This exam was performed according to the departmental dose-optimization program which includes automated exposure control, adjustment of the mA and/or kV according to patient size and/or use of iterative reconstruction technique. CONTRAST:  OMNIPAQUE  IOHEXOL  300 MG/ML  SOLN COMPARISON:  None Available. FINDINGS: Lower chest: No acute finding Hepatobiliary: No  focal hepatic abnormality. Gallbladder unremarkable. Pancreas: No focal abnormality or ductal dilatation. Spleen: No focal abnormality.  Normal size. Adrenals/Urinary Tract: No adrenal abnormality. No focal renal abnormality. No stones or hydronephrosis. Urinary bladder is unremarkable. Stomach/Bowel: Diffuse colonic wall thickening and surrounding inflammation compatible with pancolitis. Stomach and small bowel decompressed. No bowel obstruction. Vascular/Lymphatic: No evidence of aneurysm or adenopathy. Reproductive: No visible focal abnormality. Other: No free fluid or free air. Musculoskeletal: No acute bony abnormality. IMPRESSION: Diffuse colonic wall thickening and surrounding inflammation compatible with infectious or inflammatory colitis. Electronically Signed   By: Franky Crease M.D.   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

## 2023-08-29 NOTE — Progress Notes (Signed)
 Courtesy note No billing-  Patient is seen and examined today morning.  Patient is admitted to the hospitalist service for evaluation of bright red blood per rectum.  CT of the abdomen pelvis showed infectious versus inflammatory colitis.  Patient does have bloody diarrhea. I has lower abdominal pain. Hb remained stable.  Plan to continue IV Cipro +flagyl , IV fluids, keep him NPO. Hb A1c 9.5 started accucheks, sliding scale q6hr. Will follow GI recommendations. Follow GI panel for infectious etiology. Further management as per clinical course.

## 2023-08-29 NOTE — H&P (Addendum)
 History and Physical    Raymond Lewis FMW:[id number] DOB: August 15, 1960 DOA: 08/28/2023  PCP: Oley Bascom RAMAN, NP  Patient coming from: [facility]  I have personally briefly reviewed patient's old medical records in [facility] Link  Chief Complaint: n/v/d with abdominal pain , brbpr  HPI: Raymond Lewis is a 63 y.o. male with medical history significant of DMII, Depression who presents to ED with one day of n/v/d abdominal pain associated with brbpr. Of note patient has abx course around 2 weeks ago. He notes no new food , or sick contacts.  He does endorse  severe cold a few weeks back for which he was prescribed  antibiotics. Patient noted no fever no chills. He notes currently has brbpr.( Which is noted on exam , no stool admixed brbpr)   ED Course:  Afeb, bp 180/98, hr 90 rr18, sat 100%  Fecal occult blood :+  Wbc 12.7, K hgb 18.1 ,plt 193,  Na 137, K 4.6, Cl 99, glu 282, cr 0.84  Lipase 16   NS 500, zofran  4mg   CT AB/Pelvis IMPRESSION: Diffuse colonic wall thickening and surrounding inflammation compatible with infectious or inflammatory colitis.  Tx hydromorphone  , zofran  ,,lr 1L ,metronidazole  /cipro  Review of Systems: As per HPI otherwise 10 point review of systems negative.   Past Medical History:  Diagnosis Date   Allergy    COVID-19    Depression    Pneumonia     History reviewed. No pertinent surgical history.   reports that he has never smoked. He has never used smokeless tobacco. He reports that he does not currently use alcohol. He reports that he does not use drugs.  Allergies  Allergen Reactions   Percocet [Oxycodone -Acetaminophen ] Other (See Comments)    DIAPHORETIC & NAUSEA    Family History  Problem Relation Age of Onset   Cancer Mother    Hypertension Father    Cancer Father     Prior to Admission medications   Medication Sig Start Date End Date Taking? Authorizing Provider  amoxicillin -clavulanate (AUGMENTIN ) 875-125 MG tablet Take 1 tablet by  mouth every 12 (twelve) hours. 08/17/23   Raspet, Erin K, PA-C  cyanocobalamin  (VITAMIN B12) 1000 MCG tablet Take 1 tablet (1,000 mcg total) by mouth daily. 06/06/23   Oley Bascom RAMAN, NP  cyanocobalamin  (VITAMIN B12) 1000 MCG tablet Take 1 tablet (1,000 mcg total) by mouth daily. 06/06/23   Nichols, Tonya S, NP  fluticasone  (FLONASE ) 50 MCG/ACT nasal spray Place 1 spray into both nostrils daily. 08/17/23   Raspet, Erin K, PA-C  promethazine -dextromethorphan  (PROMETHAZINE -DM) 6.25-15 MG/5ML syrup Take 5 mLs by mouth at bedtime as needed for cough. 08/17/23   Sherrell Rocky POUR, PA-C    Physical Exam: Vitals:   08/29/23 0100 08/29/23 0130 08/29/23 0200 08/29/23 0314  BP: (!) 166/84 (!) 157/98 (!) 165/90 (!) 157/92  Pulse: 74 84 82 78  Resp: 13 12 12 18   Temp:    98.3 F (36.8 C)  TempSrc:    Oral  SpO2: 92% 91% 92% 99%  Weight:      Height:        Constitutional: NAD, calm, comfortable Vitals:   08/29/23 0100 08/29/23 0130 08/29/23 0200 08/29/23 0314  BP: (!) 166/84 (!) 157/98 (!) 165/90 (!) 157/92  Pulse: 74 84 82 78  Resp: 13 12 12 18   Temp:    98.3 F (36.8 C)  TempSrc:    Oral  SpO2: 92% 91% 92% 99%  Weight:      Height:  Eyes: pupils equal ,lids and conjunctivae normal ENMT: Mucous membranes are dry.  Neck: normal, supple, no masses, no thyromegaly Respiratory: clear to auscultation bilaterally, no wheezing, no crackles. Normal respiratory effort. No accessory muscle use.  Cardiovascular: Regular rate and rhythm, no murmurs / rubs / gallops. No extremity edema. 2+ pedal pulses.  Abdomen: no tenderness, no masses palpated. No hepatosplenomegaly. Bowel sounds positive.  Musculoskeletal: no clubbing / cyanosis. No joint deformity upper and lower extremities. Good ROM, no contractures. Normal muscle tone.  Skin: no rashes, lesions, ulcers. No induration Neurologic: CN  grossly intact. Sensation intact, Strength 5/5 in all 4.  Psychiatric: Normal judgment and insight. Alert and  oriented x 3. Normal mood.    Labs on Admission: I have personally reviewed following labs and imaging studies  CBC: Recent Labs  Lab 08/28/23 2111  WBC 12.7*  NEUTROABS 10.9*  HGB 18.1*  HCT 51.4  MCV 86.2  PLT 193   Basic Metabolic Panel: Recent Labs  Lab 08/28/23 2111  NA 137  K 4.6  CL 99  CO2 26  GLUCOSE 282*  BUN 9  CREATININE 0.85  CALCIUM 10.1   GFR: Estimated Creatinine Clearance: 104.7 mL/min (by C-G formula based on SCr of 0.85 mg/dL). Liver Function Tests: Recent Labs  Lab 08/28/23 2111  AST 18  ALT 33  ALKPHOS 98  BILITOT 2.4*  PROT 8.6*  ALBUMIN 4.6   Recent Labs  Lab 08/28/23 2111  LIPASE 16   No results for input(s): AMMONIA in the last 168 hours. Coagulation Profile: No results for input(s): INR, PROTIME in the last 168 hours. Cardiac Enzymes: No results for input(s): CKTOTAL, CKMB, CKMBINDEX, TROPONINI in the last 168 hours. BNP (last 3 results) No results for input(s): PROBNP in the last 8760 hours. HbA1C: No results for input(s): HGBA1C in the last 72 hours. CBG: No results for input(s): GLUCAP in the last 168 hours. Lipid Profile: No results for input(s): CHOL, HDL, LDLCALC, TRIG, CHOLHDL, LDLDIRECT in the last 72 hours. Thyroid Function Tests: No results for input(s): TSH, T4TOTAL, FREET4, T3FREE, THYROIDAB in the last 72 hours. Anemia Panel: No results for input(s): VITAMINB12, FOLATE, FERRITIN, TIBC, IRON, RETICCTPCT in the last 72 hours. Urine analysis: No results found for: COLORURINE, APPEARANCEUR, LABSPEC, PHURINE, GLUCOSEU, HGBUR, BILIRUBINUR, KETONESUR, PROTEINUR, UROBILINOGEN, NITRITE, LEUKOCYTESUR  Radiological Exams on Admission: CT ABDOMEN PELVIS W CONTRAST Result Date: 08/28/2023 CLINICAL DATA:  Abdominal pain, rectal bleeding EXAM: CT ABDOMEN AND PELVIS WITH CONTRAST TECHNIQUE: Multidetector CT imaging of the abdomen and pelvis was  performed using the standard protocol following bolus administration of intravenous contrast. RADIATION DOSE REDUCTION: This exam was performed according to the departmental dose-optimization program which includes automated exposure control, adjustment of the mA and/or kV according to patient size and/or use of iterative reconstruction technique. CONTRAST:  OMNIPAQUE  IOHEXOL  300 MG/ML  SOLN COMPARISON:  None Available. FINDINGS: Lower chest: No acute finding Hepatobiliary: No focal hepatic abnormality. Gallbladder unremarkable. Pancreas: No focal abnormality or ductal dilatation. Spleen: No focal abnormality.  Normal size. Adrenals/Urinary Tract: No adrenal abnormality. No focal renal abnormality. No stones or hydronephrosis. Urinary bladder is unremarkable. Stomach/Bowel: Diffuse colonic wall thickening and surrounding inflammation compatible with pancolitis. Stomach and small bowel decompressed. No bowel obstruction. Vascular/Lymphatic: No evidence of aneurysm or adenopathy. Reproductive: No visible focal abnormality. Other: No free fluid or free air. Musculoskeletal: No acute bony abnormality. IMPRESSION: Diffuse colonic wall thickening and surrounding inflammation compatible with infectious or inflammatory colitis. Electronically Signed   By: Franky  Dover M.D.   On: 08/28/2023 23:22    EKG: Independently reviewed.   Assessment/Plan  Acute colitis nos GI bleed - admit to med tele  - continue metronidazole  and cirpofloxacin  - supportive care with ivfs , pain medications and anti-emetics  -follow up on stools studies -monitor h/h , transfuse if < 7 -CT bleeding scan  -gi to see in am    DMII - iss/fs  -no medication noted on current med list - f/u with med rec in am  - per patient he does noted have hx of DMII - he states he has elevated sugars with hx of severe covid but since then his BS have been stable  -will check A1c    Depression -resume home regimen as able   DVT  prophylaxis: SCD Code Status: full/ as discussed per patient wishes in event of cardiac arrest  Family Communication: none at bedside Disposition Plan: patient  expected to be admitted greater than 2 midnights  Consults called: GI- dr Dianna Admission status: tele   Camila DELENA Ned MD [facility]   If 7PM-7AM, please contact night-coverage [email or web address] Password [facility]  08/29/2023, 3:55 AM

## 2023-08-29 NOTE — Progress Notes (Signed)
 Plan of Care Note for accepted transfer   Patient: Raymond Lewis MRN: [id number]   DOA: 08/28/2023  Facility requesting transfer: [facility]   Requesting Provider: Dr. Lorette  Reason for transfer: Colitis   Facility course: 63 yr old male with T2DM and recent antibiotic use presents with abdominal pain, N/V/D, and then BRBPR.   He is afebrile with stable vitals. Labs notable for glucose 282, t bili 2.4, WBC 12.7, and Hgb 18.1. CT demonstrates diffuse colonic wall-thickening with surrounding inflammatory changes.   Blood culture was collected and he was treated with IVF, Dilaudid , Zofran , Cipro , and Flagyl . ED physician sent secure chat to GI with request for routine consult.   Plan of care: The patient is accepted for admission to Med-surg  unit, at [facility].   Author: Evalene GORMAN Sprinkles, MD 08/29/2023  Check [email or web address] for on-call coverage.  Nursing staff, Please call [facility] Admits & Consults System-Wide number on Amion as soon as patient's arrival, so appropriate admitting provider can evaluate the pt.

## 2023-08-29 NOTE — TOC Progression Note (Signed)
 Transition of Care [facility]) - Progression Note    Patient Details  Name: [name] MRN: [id number] Date of Birth: 12-Sep-1960  Transition of Care [facility]) CM/SW Contact  Doneta Glenys DASEN, RN Phone Number: 08/29/2023, 3:28 PM  Clinical Narrative:    CM went by patients room on several occasion and patient not present. CM spoke to patient on the telephone. CM introduce and explained my role in providing care. Patient refused to share information even after explaining my role again. CM informed patient that CM would reach out again if a need presented. CM not following.                     Expected Discharge Plan and Services                                               Social Drivers of Health (SDOH) Interventions SDOH Screenings   Food Insecurity: No Food Insecurity (08/29/2023)  Housing: Low Risk  (08/29/2023)  Transportation Needs: No Transportation Needs (08/29/2023)  Utilities: Not At Risk (08/29/2023)  Depression (PHQ2-9): Low Risk  (06/03/2023)  Tobacco Use: Low Risk  (08/28/2023)    Readmission Risk Interventions    08/29/2023    3:27 PM  Readmission Risk Prevention Plan  Post Dischage Appt Patient refused  Medication Screening Complete  Transportation Screening Patient refused

## 2023-08-30 DIAGNOSIS — K625 Hemorrhage of anus and rectum: Secondary | ICD-10-CM | POA: Diagnosis not present

## 2023-08-30 DIAGNOSIS — E876 Hypokalemia: Secondary | ICD-10-CM | POA: Diagnosis not present

## 2023-08-30 DIAGNOSIS — K529 Noninfective gastroenteritis and colitis, unspecified: Secondary | ICD-10-CM | POA: Diagnosis not present

## 2023-08-30 DIAGNOSIS — E1165 Type 2 diabetes mellitus with hyperglycemia: Secondary | ICD-10-CM | POA: Diagnosis not present

## 2023-08-30 LAB — CBC
HCT: 44.5 % (ref 39.0–52.0)
Hemoglobin: 15.7 g/dL (ref 13.0–17.0)
MCH: 31.5 pg (ref 26.0–34.0)
MCHC: 35.3 g/dL (ref 30.0–36.0)
MCV: 89.2 fL (ref 80.0–100.0)
Platelets: 144 K/uL — ABNORMAL LOW (ref 150–400)
RBC: 4.99 MIL/uL (ref 4.22–5.81)
RDW: 13.1 % (ref 11.5–15.5)
WBC: 8.7 K/uL (ref 4.0–10.5)
nRBC: 0 % (ref 0.0–0.2)

## 2023-08-30 LAB — GASTROINTESTINAL PANEL BY PCR, STOOL (REPLACES STOOL CULTURE)

## 2023-08-30 LAB — BASIC METABOLIC PANEL WITH GFR
Anion gap: 8 (ref 5–15)
BUN: 11 mg/dL (ref 8–23)
CO2: 26 mmol/L (ref 22–32)
Calcium: 8.4 mg/dL — ABNORMAL LOW (ref 8.9–10.3)
Chloride: 101 mmol/L (ref 98–111)
Creatinine, Ser: 0.76 mg/dL (ref 0.61–1.24)
GFR, Estimated: >60 mL/min (ref >60)
Glucose, Bld: 198 mg/dL — ABNORMAL HIGH (ref 70–99)
Potassium: 3.3 mmol/L — ABNORMAL LOW (ref 3.5–5.1)
Sodium: 135 mmol/L (ref 135–145)

## 2023-08-30 LAB — GLUCOSE, CAPILLARY
Glucose-Capillary: 139 mg/dL — ABNORMAL HIGH (ref 70–99)
Glucose-Capillary: 174 mg/dL — ABNORMAL HIGH (ref 70–99)
Glucose-Capillary: 181 mg/dL — ABNORMAL HIGH (ref 70–99)
Glucose-Capillary: 186 mg/dL — ABNORMAL HIGH (ref 70–99)

## 2023-08-30 MED ORDER — SODIUM CHLORIDE 0.9% FLUSH: 10.0000 mL | INTRAVENOUS | Status: DC | PRN

## 2023-08-30 MED ORDER — INSULIN ASPART 100 UNIT/ML IJ SOLN: 0.0000 [IU] | Freq: Three times a day (TID) | INTRAMUSCULAR | Status: DC

## 2023-08-30 MED ORDER — POTASSIUM CHLORIDE 20 MEQ PO PACK
40.0000 meq | PACK | Freq: Every day | ORAL | Status: DC
  Administered 2023-08-30: 40 meq via ORAL
  Filled 2023-08-30: qty 2

## 2023-08-30 NOTE — Progress Notes (Signed)
 [facility] Progress Note  SUBJECTIVE:   Interval history: Raymond Lewis was seen and evaluated today at bedside. Resting comfortably. Noted that rectal bleeding has slowed, he has had roughly 4 bowel movements today which is much improved from yesterday (was having BM roughly every 15 min per patient). His abdominal pain is improved today as well. He is able to tolerate clear liquids. His stool studies thus far are not positive for infectious process.  Past Medical History:  Diagnosis Date   Allergy    COVID-19    Depression    Pneumonia    History reviewed. No pertinent surgical history. Current Facility-Administered Medications  Medication Dose Route Frequency Provider Last Rate Last Admin   0.9 %  sodium chloride  infusion   Intravenous Continuous Zackowski, Scott, MD 100 mL/hr at 08/29/23 2316 New Bag at 08/29/23 2316   albuterol  (PROVENTIL ) (2.5 MG/3ML) 0.083% nebulizer solution 2.5 mg  2.5 mg Nebulization Q2H PRN Thomas, Sara-Maiz A, MD       ciprofloxacin  (CIPRO ) IVPB 400 mg  400 mg Intravenous Q12H Debby Hitch A, MD 200 mL/hr at 08/30/23 1518 400 mg at 08/30/23 1518   fentaNYL  (SUBLIMAZE ) injection 12.5-50 mcg  12.5-50 mcg Intravenous Q2H PRN Thomas, Sara-Maiz A, MD       insulin  aspart (novoLOG ) injection 0-9 Units  0-9 Units Subcutaneous Q6H Sreeram, Narendranath, MD       metroNIDAZOLE  (FLAGYL ) IVPB 500 mg  500 mg Intravenous Q12H Debby Hitch A, MD 100 mL/hr at 08/30/23 1226 500 mg at 08/30/23 1226   ondansetron  (ZOFRAN ) tablet 4 mg  4 mg Oral Q6H PRN Debby Hitch LABOR, MD       Or   ondansetron  (ZOFRAN ) injection 4 mg  4 mg Intravenous Q6H PRN Debby Hitch LABOR, MD       pantoprazole  (PROTONIX ) injection 40 mg  40 mg Intravenous Q12H Debby Hitch A, MD   40 mg at 08/30/23 0947   potassium chloride  (KLOR-CON ) packet 40 mEq  40 mEq Oral Daily Sreeram, Narendranath, MD   40 mEq at 08/30/23 0947   sodium chloride  flush (NS) 0.9 % injection 10-40 mL   10-40 mL Intracatheter PRN Darci Pore, MD       Allergies as of 08/28/2023 - Review Complete 08/28/2023  Allergen Reaction Noted   Percocet [oxycodone -acetaminophen ] Other (See Comments) 05/18/2023   Review of Systems:  Review of Systems  Gastrointestinal:  Positive for abdominal pain and blood in stool. Negative for nausea and vomiting.    OBJECTIVE:   Temp:  [98.1 F (36.7 C)-98.4 F (36.9 C)] 98.4 F (36.9 C) (07/29 1156) Pulse Rate:  [73-77] 75 (07/29 1156) Resp:  [18-19] 18 (07/29 1156) BP: (127-142)/(71-87) 130/71 (07/29 1156) SpO2:  [99 %-100 %] 100 % (07/29 1156)   Physical Exam Constitutional:      General: He is not in acute distress.    Appearance: He is not ill-appearing, toxic-appearing or diaphoretic.  Cardiovascular:     Comments: Regular pulse Pulmonary:     Effort: No respiratory distress.  Abdominal:     General: Bowel sounds are normal. There is no distension.     Palpations: Abdomen is soft.     Tenderness: There is no abdominal tenderness. There is no guarding.  Neurological:     Mental Status: He is alert.     Labs: Recent Labs    08/28/23 2111 08/29/23 0428 08/29/23 1236 08/30/23 0508  WBC 12.7* 12.2*  --  8.7  HGB 18.1* 17.0 17.2* 15.7  HCT 51.4 48.7 49.2 44.5  PLT 193 180  --  144*   BMET Recent Labs    08/28/23 2111 08/29/23 0428 08/30/23 0508  NA 137  --  135  K 4.6  --  3.3*  CL 99  --  101  CO2 26  --  26  GLUCOSE 282*  --  198*  BUN 9  --  11  CREATININE 0.85 0.54* 0.76  CALCIUM 10.1  --  8.4*   LFT Recent Labs    08/28/23 2111  PROT 8.6*  ALBUMIN 4.6  AST 18  ALT 33  ALKPHOS 98  BILITOT 2.4*   PT/INR No results for input(s): LABPROT, INR in the last 72 hours. Diagnostic imaging: CT ANGIO GI BLEED Result Date: 08/29/2023 CLINICAL DATA:  Nausea/vomiting/diarrhea, abdominal pain, bright red blood per rectum EXAM: CTA ABDOMEN AND PELVIS WITHOUT AND WITH CONTRAST TECHNIQUE: Multidetector CT imaging  of the abdomen and pelvis was performed using the standard protocol during bolus administration of intravenous contrast. Multiplanar reconstructed images and MIPs were obtained and reviewed to evaluate the vascular anatomy. RADIATION DOSE REDUCTION: This exam was performed according to the departmental dose-optimization program which includes automated exposure control, adjustment of the mA and/or kV according to patient size and/or use of iterative reconstruction technique. CONTRAST:  OMNIPAQUE  IOHEXOL  350 MG/ML SOLN COMPARISON:  08/28/2023 FINDINGS: VASCULAR Aorta: Normal caliber aorta without aneurysm, dissection, vasculitis or significant stenosis. Celiac: Patent without evidence of aneurysm, dissection, vasculitis or significant stenosis. SMA: Patent without evidence of aneurysm, dissection, vasculitis or significant stenosis. Renals: Both renal arteries are patent without evidence of aneurysm, dissection, vasculitis, fibromuscular dysplasia or significant stenosis. IMA: Patent without evidence of aneurysm, dissection, vasculitis or significant stenosis. Inflow: Patent without evidence of aneurysm, dissection, vasculitis or significant stenosis. Proximal Outflow: Bilateral common femoral and visualized portions of the superficial and profunda femoral arteries are patent without evidence of aneurysm, dissection, vasculitis or significant stenosis. Veins: No obvious venous abnormality within the limitations of this arterial phase study. Review of the MIP images confirms the above findings. NON-VASCULAR Lower chest: No acute pleural or parenchymal lung disease. Hepatobiliary: No focal liver abnormality is seen. No gallstones, gallbladder wall thickening, or biliary dilatation. Pancreas: Unremarkable. No pancreatic ductal dilatation or surrounding inflammatory changes. Spleen: Normal in size without focal abnormality. Adrenals/Urinary Tract: Adrenal glands are unremarkable. Kidneys are normal, without renal  calculi, focal lesion, or hydronephrosis. Bladder is unremarkable. Stomach/Bowel: Diffuse colonic wall thickening is again noted, unchanged, compatible with inflammatory or infectious colitis. There is no intraluminal accumulation of contrast to suggest active gastrointestinal hemorrhage. No bowel obstruction or ileus. Normal appendix right lower quadrant. Lymphatic: No pathologic adenopathy. Reproductive: Prostate is unremarkable. Other: Trace pelvic free fluid is likely reactive. No free intraperitoneal gas. No abdominal wall hernia. Musculoskeletal: No acute or destructive bony abnormalities. Reconstructed images demonstrate no additional findings. IMPRESSION: VASCULAR 1. No evidence of active gastrointestinal hemorrhage. 2. No significant vascular finding. NON-VASCULAR 1. Stable diffuse colonic wall thickening compatible with inflammatory or infectious colitis. 2. Trace pelvic free fluid, likely reactive. Electronically Signed   By: Ozell Daring M.D.   On: 08/29/2023 14:39   CT ABDOMEN PELVIS W CONTRAST Result Date: 08/28/2023 CLINICAL DATA:  Abdominal pain, rectal bleeding EXAM: CT ABDOMEN AND PELVIS WITH CONTRAST TECHNIQUE: Multidetector CT imaging of the abdomen and pelvis was performed using the standard protocol following bolus administration of intravenous contrast. RADIATION DOSE REDUCTION: This exam was performed according to the departmental dose-optimization program which includes automated  exposure control, adjustment of the mA and/or kV according to patient size and/or use of iterative reconstruction technique. CONTRAST:  100mL OMNIPAQUE  IOHEXOL  300 MG/ML  SOLN COMPARISON:  None Available. FINDINGS: Lower chest: No acute finding Hepatobiliary: No focal hepatic abnormality. Gallbladder unremarkable. Pancreas: No focal abnormality or ductal dilatation. Spleen: No focal abnormality.  Normal size. Adrenals/Urinary Tract: No adrenal abnormality. No focal renal abnormality. No stones or  hydronephrosis. Urinary bladder is unremarkable. Stomach/Bowel: Diffuse colonic wall thickening and surrounding inflammation compatible with pancolitis. Stomach and small bowel decompressed. No bowel obstruction. Vascular/Lymphatic: No evidence of aneurysm or adenopathy. Reproductive: No visible focal abnormality. Other: No free fluid or free air. Musculoskeletal: No acute bony abnormality. IMPRESSION: Diffuse colonic wall thickening and surrounding inflammation compatible with infectious or inflammatory colitis. Electronically Signed   By: Franky Crease M.D.   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, etiology unspecified             -Recent antibiotic use, Augmentin   -GI pathogen panel (-), c.diff (-) Rectal bleeding, suspect secondary to above  -No prior colonoscopy Abdominal pain secondary to above  History COVID-19  PLAN: -Follow remaining stool studies, continue empiric antibiotic therapy, likely 5-7 days total  -Rectal bleeding has slowed, Hgb stable -Discussed options for evaluation of colitis with patient including colonoscopy, he would like to wait on any endoscopic procedure given current colitis and risk of perforation -Plan to treat supportively with antibiotics, advance diet, follow up with [facility] in outpatient setting in 4-6 weeks, can plan to repeat imaging/discuss colonoscopy at that time -[facility] will sign off and be available should any questions arise   LOS: 1 day   Estefana Keas, DO [facility]

## 2023-08-30 NOTE — TOC Progression Note (Signed)
 Transition of Care [facility]) - Progression Note    Patient Details  Name: Raymond Lewis MRN: [id number] Date of Birth: Jun 04, 1960  [facility]) CM/SW Contact  Doneta Glenys DASEN, RN Phone Number: 08/30/2023, 11:05 AM  Clinical Narrative:    CM spoke with patient and wife Damita [phone] in the room. Discussed resources for housing, food, utility and agreeable to adding resources to AVS. Verified PCP/insurance;No DME, HH or oxygen; Patients wife will transport at discharge. No additional TOC needs identified during visit. Please place consult if needs present.   Expected Discharge Plan: Home/Self Care Barriers to Discharge: No Barriers Identified               Expected Discharge Plan and Services In-house Referral: NA Discharge Planning Services: NA Post Acute Care Choice: NA Living arrangements for the past 2 months: Apartment                 DME Arranged: N/A DME Agency: NA       HH Arranged: NA HH Agency: NA         Social Drivers of Health (SDOH) Interventions SDOH Screenings   Food Insecurity: No Food Insecurity (08/29/2023)  Housing: Low Risk  (08/29/2023)  Transportation Needs: No Transportation Needs (08/29/2023)  Utilities: Not At Risk (08/29/2023)  Depression (PHQ2-9): Low Risk  (06/03/2023)  Tobacco Use: Low Risk  (08/28/2023)    Readmission Risk Interventions    08/30/2023   10:56 AM 08/29/2023    3:27 PM  Readmission Risk Prevention Plan  Post Dischage Appt Complete Patient refused  Medication Screening Complete Complete  Transportation Screening Complete Patient refused

## 2023-08-30 NOTE — Progress Notes (Signed)
 Progress Note   Patient: Raymond Lewis FMW:[id number] DOB: 09-Aug-1960 DOA: 08/28/2023     1 DOS: the patient was seen and examined on 08/30/2023   Brief hospital course: Alyaan Budzynski is a 63 y.o. male with medical history significant of DMII, Depression who presents to ED with one day of n/v/d abdominal pain associated with brbpr. CT abdomen/ pelvis findings consistent with infectious vs inflammatory colitis. Admitted to [facility] for further management with GI evaluation.  Assessment and Plan: Acute colitis- Bright red blood per rectum due to his severe colitis. Continue Cipro  plus metronidazole  for 5 to 7 days per GI. H&H remained stable.  Continue to monitor. Stool studies reviewed, unremarkable. GI evaluation and follow-up appreciated.  He will need outpatient colonoscopy in 4 to 6 weeks. Plan to discharge him once diarrhea improves.. Clears for now and advance as tolerated.  Tyoe 2 diabetes mellitus- A1c 9.5. Continue accucheks, sliding scale for now.  Hypokalemia- Continue oral supplements and recheck.     Out of bed to chair. Incentive spirometry. Nursing supportive care. Fall, aspiration precautions. Diet:  Diet Orders (From admission, onward)     Start     Ordered   08/30/23 1118  Diet clear liquid Room service appropriate? Yes; Fluid consistency: Thin  Diet effective now       Question Answer Comment  Room service appropriate? Yes   Fluid consistency: Thin      08/30/23 1117           DVT prophylaxis: Place and maintain sequential compression device Start: 08/29/23 0436  Level of care: Med-Surg   Code Status: Full Code  Subjective: Patient is seen and examined today morning.  States that his diarrhea is better, still has mucus, blood.  Wife at bedside.  He does not have appetite but states nausea is better, wishes to start clears.  Abdominal pain much improved  Physical Exam: Vitals:   08/29/23 1209 08/29/23 2001 08/30/23 0626 08/30/23 1156  BP: (!) 162/87  (!) 142/80 127/87 130/71  Pulse: 69 77 73 75  Resp: 18 18 19 18   Temp: 98.3 F (36.8 C) 98.2 F (36.8 C) 98.1 F (36.7 C) 98.4 F (36.9 C)  TempSrc: Oral  Oral Oral  SpO2: 99% 99% 100% 100%  Weight:      Height:        General - Elderly African-American male, no apparent distress HEENT - PERRLA, EOMI, atraumatic head, non tender sinuses. Lung - Clear, no rales, rhonchi, wheezes. Heart - S1, S2 heard, no murmurs, rubs, trace pedal edema. Abdomen - Soft, lower abdominal tender, nondistended, bowel sounds good Neuro - Alert, awake and oriented x 3, non focal exam. Skin - Warm and dry.  Data Reviewed:      Latest Ref Rng & Units 08/30/2023    5:08 AM 08/29/2023   12:36 PM 08/29/2023    4:28 AM  CBC  WBC 4.0 - 10.5 K/uL 8.7   12.2   Hemoglobin 13.0 - 17.0 g/dL 84.2  82.7  82.9   Hematocrit 39.0 - 52.0 % 44.5  49.2  48.7   Platelets 150 - 400 K/uL 144   180       Latest Ref Rng & Units 08/30/2023    5:08 AM 08/29/2023    4:28 AM 08/28/2023    9:11 PM  BMP  Glucose 70 - 99 mg/dL 801   717   BUN 8 - 23 mg/dL 11   9   Creatinine 9.38 - 1.24 mg/dL 9.23  0.54  0.85   Sodium 135 - 145 mmol/L 135   137   Potassium 3.5 - 5.1 mmol/L 3.3   4.6   Chloride 98 - 111 mmol/L 101   99   CO2 22 - 32 mmol/L 26   26   Calcium 8.9 - 10.3 mg/dL 8.4   89.8    CT ANGIO GI BLEED Result Date: 08/29/2023 CLINICAL DATA:  Nausea/vomiting/diarrhea, abdominal pain, bright red blood per rectum EXAM: CTA ABDOMEN AND PELVIS WITHOUT AND WITH CONTRAST TECHNIQUE: Multidetector CT imaging of the abdomen and pelvis was performed using the standard protocol during bolus administration of intravenous contrast. Multiplanar reconstructed images and MIPs were obtained and reviewed to evaluate the vascular anatomy. RADIATION DOSE REDUCTION: This exam was performed according to the departmental dose-optimization program which includes automated exposure control, adjustment of the mA and/or kV according to patient size  and/or use of iterative reconstruction technique. CONTRAST:  OMNIPAQUE  IOHEXOL  350 MG/ML SOLN COMPARISON:  08/28/2023 FINDINGS: VASCULAR Aorta: Normal caliber aorta without aneurysm, dissection, vasculitis or significant stenosis. Celiac: Patent without evidence of aneurysm, dissection, vasculitis or significant stenosis. SMA: Patent without evidence of aneurysm, dissection, vasculitis or significant stenosis. Renals: Both renal arteries are patent without evidence of aneurysm, dissection, vasculitis, fibromuscular dysplasia or significant stenosis. IMA: Patent without evidence of aneurysm, dissection, vasculitis or significant stenosis. Inflow: Patent without evidence of aneurysm, dissection, vasculitis or significant stenosis. Proximal Outflow: Bilateral common femoral and visualized portions of the superficial and profunda femoral arteries are patent without evidence of aneurysm, dissection, vasculitis or significant stenosis. Veins: No obvious venous abnormality within the limitations of this arterial phase study. Review of the MIP images confirms the above findings. NON-VASCULAR Lower chest: No acute pleural or parenchymal lung disease. Hepatobiliary: No focal liver abnormality is seen. No gallstones, gallbladder wall thickening, or biliary dilatation. Pancreas: Unremarkable. No pancreatic ductal dilatation or surrounding inflammatory changes. Spleen: Normal in size without focal abnormality. Adrenals/Urinary Tract: Adrenal glands are unremarkable. Kidneys are normal, without renal calculi, focal lesion, or hydronephrosis. Bladder is unremarkable. Stomach/Bowel: Diffuse colonic wall thickening is again noted, unchanged, compatible with inflammatory or infectious colitis. There is no intraluminal accumulation of contrast to suggest active gastrointestinal hemorrhage. No bowel obstruction or ileus. Normal appendix right lower quadrant. Lymphatic: No pathologic adenopathy. Reproductive: Prostate is  unremarkable. Other: Trace pelvic free fluid is likely reactive. No free intraperitoneal gas. No abdominal wall hernia. Musculoskeletal: No acute or destructive bony abnormalities. Reconstructed images demonstrate no additional findings. IMPRESSION: VASCULAR 1. No evidence of active gastrointestinal hemorrhage. 2. No significant vascular finding. NON-VASCULAR 1. Stable diffuse colonic wall thickening compatible with inflammatory or infectious colitis. 2. Trace pelvic free fluid, likely reactive. Electronically Signed   By: Ozell Daring M.D.   On: 08/29/2023 14:39   CT ABDOMEN PELVIS W CONTRAST Result Date: 08/28/2023 CLINICAL DATA:  Abdominal pain, rectal bleeding EXAM: CT ABDOMEN AND PELVIS WITH CONTRAST TECHNIQUE: Multidetector CT imaging of the abdomen and pelvis was performed using the standard protocol following bolus administration of intravenous contrast. RADIATION DOSE REDUCTION: This exam was performed according to the departmental dose-optimization program which includes automated exposure control, adjustment of the mA and/or kV according to patient size and/or use of iterative reconstruction technique. CONTRAST:  100mL OMNIPAQUE  IOHEXOL  300 MG/ML  SOLN COMPARISON:  None Available. FINDINGS: Lower chest: No acute finding Hepatobiliary: No focal hepatic abnormality. Gallbladder unremarkable. Pancreas: No focal abnormality or ductal dilatation. Spleen: No focal abnormality.  Normal size. Adrenals/Urinary Tract: No adrenal abnormality. No  focal renal abnormality. No stones or hydronephrosis. Urinary bladder is unremarkable. Stomach/Bowel: Diffuse colonic wall thickening and surrounding inflammation compatible with pancolitis. Stomach and small bowel decompressed. No bowel obstruction. Vascular/Lymphatic: No evidence of aneurysm or adenopathy. Reproductive: No visible focal abnormality. Other: No free fluid or free air. Musculoskeletal: No acute bony abnormality. IMPRESSION: Diffuse colonic wall  thickening and surrounding inflammation compatible with infectious or inflammatory colitis. Electronically Signed   By: Franky Crease M.D.   On: 08/28/2023 23:22    Family Communication: Discussed with patient, wife at bedside.  They understand and agree. All questions answered.  Disposition: Status is: Inpatient Remains inpatient appropriate because: Advance diet, IV antibiotics  Planned Discharge Destination: Home     Time spent: 42 minutes  Author: Concepcion Riser, MD 08/30/2023 5:53 PM Secure chat 7am to 7pm For on call review [email or web address].

## 2023-08-30 NOTE — Plan of Care (Signed)

## 2023-08-31 ENCOUNTER — Other Ambulatory Visit: Payer: Self-pay

## 2023-08-31 DIAGNOSIS — K625 Hemorrhage of anus and rectum: Secondary | ICD-10-CM | POA: Insufficient documentation

## 2023-08-31 DIAGNOSIS — E876 Hypokalemia: Secondary | ICD-10-CM | POA: Insufficient documentation

## 2023-08-31 DIAGNOSIS — E1165 Type 2 diabetes mellitus with hyperglycemia: Secondary | ICD-10-CM | POA: Diagnosis not present

## 2023-08-31 DIAGNOSIS — K529 Noninfective gastroenteritis and colitis, unspecified: Secondary | ICD-10-CM | POA: Diagnosis not present

## 2023-08-31 LAB — CBC
HCT: 38.8 % — ABNORMAL LOW (ref 39.0–52.0)
Hemoglobin: 13.8 g/dL (ref 13.0–17.0)
MCH: 31.4 pg (ref 26.0–34.0)
MCHC: 35.6 g/dL (ref 30.0–36.0)
MCV: 88.4 fL (ref 80.0–100.0)
Platelets: 129 K/uL — ABNORMAL LOW (ref 150–400)
RBC: 4.39 MIL/uL (ref 4.22–5.81)
RDW: 12.6 % (ref 11.5–15.5)
WBC: 4.1 K/uL (ref 4.0–10.5)
nRBC: 0 % (ref 0.0–0.2)

## 2023-08-31 LAB — GLUCOSE, CAPILLARY
Glucose-Capillary: 137 mg/dL — ABNORMAL HIGH (ref 70–99)
Glucose-Capillary: 144 mg/dL — ABNORMAL HIGH (ref 70–99)
Glucose-Capillary: 155 mg/dL — ABNORMAL HIGH (ref 70–99)
Glucose-Capillary: 203 mg/dL — ABNORMAL HIGH (ref 70–99)

## 2023-08-31 LAB — BASIC METABOLIC PANEL WITH GFR
Anion gap: 5 (ref 5–15)
BUN: 9 mg/dL (ref 8–23)
CO2: 24 mmol/L (ref 22–32)
Calcium: 7.9 mg/dL — ABNORMAL LOW (ref 8.9–10.3)
Chloride: 103 mmol/L (ref 98–111)
Creatinine, Ser: 0.76 mg/dL (ref 0.61–1.24)
GFR, Estimated: >60 mL/min (ref >60)
Glucose, Bld: 188 mg/dL — ABNORMAL HIGH (ref 70–99)
Potassium: 3.2 mmol/L — ABNORMAL LOW (ref 3.5–5.1)
Sodium: 132 mmol/L — ABNORMAL LOW (ref 135–145)

## 2023-08-31 LAB — CALPROTECTIN, FECAL: Calprotectin, Fecal: 65 ug/g (ref 0–120)

## 2023-08-31 MED ORDER — METRONIDAZOLE 500 MG PO TABS
500.0000 mg | ORAL_TABLET | Freq: Two times a day (BID) | ORAL | 0 refills | Status: AC
  Filled 2023-08-31: qty 10, 5d supply, fill #0

## 2023-08-31 MED ORDER — POTASSIUM CHLORIDE 20 MEQ PO PACK
40.0000 meq | PACK | Freq: Two times a day (BID) | ORAL | Status: DC
  Administered 2023-08-31: 40 meq via ORAL
  Filled 2023-08-31: qty 2

## 2023-08-31 MED ORDER — SODIUM CHLORIDE 0.9 % IV SOLN
2.0000 g | INTRAVENOUS | Status: DC
  Filled 2023-08-31: qty 20

## 2023-08-31 MED ORDER — CIPROFLOXACIN HCL 500 MG PO TABS
500.0000 mg | ORAL_TABLET | Freq: Two times a day (BID) | ORAL | 0 refills | Status: AC
  Filled 2023-08-31: qty 10, 5d supply, fill #0

## 2023-08-31 NOTE — Discharge Summary (Signed)
 Physician Discharge Summary   Patient: Raymond Lewis MRN: [id number] DOB: 1960-02-26  Admit date:     08/28/2023  Discharge date: 08/31/23  Discharge Physician: Concepcion Riser   PCP: Oley Bascom RAMAN, NP   Recommendations at discharge:   PCP follow up in 1 week. GI follow up as scheduled. Refused to get Diabetes medications, wishes to talk to PCP.  Discharge Diagnoses: Principal Problem:   Acute colitis Active Problems:   Uncontrolled type 2 diabetes mellitus with hyperglycemia, without long-term current use of insulin  (HCC)   BRBPR (bright red blood per rectum)   Hypokalemia  Resolved Problems:   * No resolved hospital problems. *  Hospital Course: Raymond Lewis is a 63 y.o. male with medical history significant of DMII, Depression who presents to ED with one day of n/v/d abdominal pain associated with brbpr. CT abdomen/ pelvis findings consistent with infectious vs inflammatory colitis. Admitted to [facility] for further management with GI evaluation.   Assessment and Plan: Acute colitis-infectious vs inflammatory: Bright red blood per rectum due to his severe colitis. H&H remained stable. Stool studies reviewed, unremarkable. GI evaluation and follow-up appreciated.  He will need outpatient colonoscopy in 4 to 6 weeks. Continue 5-7 days of antibiotics, script for Cipro +flagyl  sent to pharmacy. He is able to tolerate diet well, bowel movements loose but pain less, non bloody. He wishes to go home with PCP and GI follow up as scheduled.  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 not on any meds at home. Claims diabetes started since he had Covid and was on steroids. He does not wish to start any DM meds, he wishes to talk to PCP regarding management. He understands  Continue accucheks, sliding scale for now.   Hypokalemia- Replaced. Outpatient BMP follow up in 1 week.        Consultants: GI Procedures performed: none  Disposition: Home Diet recommendation:   Discharge Diet Orders (From admission, onward)     Start     Ordered   08/31/23 0000  Diet - low sodium heart healthy        08/31/23 1452   08/31/23 0000  Diet Carb Modified        08/31/23 1452           Cardiac and Carb modified diet DISCHARGE MEDICATION: Allergies as of 08/31/2023       Reactions   Percocet [oxycodone -acetaminophen ] Other (See Comments)   DIAPHORETIC & NAUSEA        Medication List     STOP taking these medications    amoxicillin -clavulanate 875-125 MG tablet Commonly known as: AUGMENTIN    fluticasone  50 MCG/ACT nasal spray Commonly known as: FLONASE    promethazine -dextromethorphan  6.25-15 MG/5ML syrup Commonly known as: PROMETHAZINE -DM       TAKE these medications    B-12 1000 MCG Tabs Take 1 tablet (1,000 mcg total) by mouth daily.   ciprofloxacin  500 MG tablet Commonly known as: Cipro  Take 1 tablet (500 mg total) by mouth 2 (two) times daily for 5 days.   metroNIDAZOLE  500 MG tablet Commonly known as: FLAGYL  Take 1 tablet (500 mg total) by mouth 2 (two) times daily for 5 days.        Follow-up Information     Oley Bascom RAMAN, NP. Schedule an appointment as soon as possible for a visit in 1 week(s).   Specialties: Pulmonary Disease, Endocrinology Contact information: [address] [place] [address] [phone]         Kriss Stagger  H, DO. Schedule an appointment as soon as possible for a visit in 3 week(s).   Specialty: Gastroenterology Contact information: [address] [place] [address] [phone]                Discharge Exam: Raymond Lewis   08/28/23 2005  Weight: 91.2 kg      08/31/2023    1:12 PM 08/31/2023    5:15 AM 08/30/2023    8:03 PM  Vitals with BMI  Systolic 157 122 857  Diastolic 85 63 82  Pulse 78 74 66   General - Elderly African-American male, no apparent distress HEENT - PERRLA, EOMI, atraumatic head, non tender sinuses. Lung - Clear, no  rales, rhonchi, wheezes. Heart - S1, S2 heard, no murmurs, rubs, trace pedal edema. Abdomen - Soft, lower abdominal tender, nondistended, bowel sounds good Neuro - Alert, awake and oriented x 3, non focal exam. Skin - Warm and dry.  Condition at discharge: stable  The results of significant diagnostics from this hospitalization (including imaging, microbiology, ancillary and laboratory) are listed below for reference.   Imaging Studies: CT ANGIO GI BLEED Result Date: 08/29/2023 CLINICAL DATA:  Nausea/vomiting/diarrhea, abdominal pain, bright red blood per rectum EXAM: CTA ABDOMEN AND PELVIS WITHOUT AND WITH CONTRAST TECHNIQUE: Multidetector CT imaging of the abdomen and pelvis was performed using the standard protocol during bolus administration of intravenous contrast. Multiplanar reconstructed images and MIPs were obtained and reviewed to evaluate the vascular anatomy. RADIATION DOSE REDUCTION: This exam was performed according to the departmental dose-optimization program which includes automated exposure control, adjustment of the mA and/or kV according to patient size and/or use of iterative reconstruction technique. CONTRAST:  OMNIPAQUE  IOHEXOL  350 MG/ML SOLN COMPARISON:  08/28/2023 FINDINGS: VASCULAR Aorta: Normal caliber aorta without aneurysm, dissection, vasculitis or significant stenosis. Celiac: Patent without evidence of aneurysm, dissection, vasculitis or significant stenosis. SMA: Patent without evidence of aneurysm, dissection, vasculitis or significant stenosis. Renals: Both renal arteries are patent without evidence of aneurysm, dissection, vasculitis, fibromuscular dysplasia or significant stenosis. IMA: Patent without evidence of aneurysm, dissection, vasculitis or significant stenosis. Inflow: Patent without evidence of aneurysm, dissection, vasculitis or significant stenosis. Proximal Outflow: Bilateral common femoral and visualized portions of the superficial and profunda  femoral arteries are patent without evidence of aneurysm, dissection, vasculitis or significant stenosis. Veins: No obvious venous abnormality within the limitations of this arterial phase study. Review of the MIP images confirms the above findings. NON-VASCULAR Lower chest: No acute pleural or parenchymal lung disease. Hepatobiliary: No focal liver abnormality is seen. No gallstones, gallbladder wall thickening, or biliary dilatation. Pancreas: Unremarkable. No pancreatic ductal dilatation or surrounding inflammatory changes. Spleen: Normal in size without focal abnormality. Adrenals/Urinary Tract: Adrenal glands are unremarkable. Kidneys are normal, without renal calculi, focal lesion, or hydronephrosis. Bladder is unremarkable. Stomach/Bowel: Diffuse colonic wall thickening is again noted, unchanged, compatible with inflammatory or infectious colitis. There is no intraluminal accumulation of contrast to suggest active gastrointestinal hemorrhage. No bowel obstruction or ileus. Normal appendix right lower quadrant. Lymphatic: No pathologic adenopathy. Reproductive: Prostate is unremarkable. Other: Trace pelvic free fluid is likely reactive. No free intraperitoneal gas. No abdominal wall hernia. Musculoskeletal: No acute or destructive bony abnormalities. Reconstructed images demonstrate no additional findings. IMPRESSION: VASCULAR 1. No evidence of active gastrointestinal hemorrhage. 2. No significant vascular finding. NON-VASCULAR 1. Stable diffuse colonic wall thickening compatible with inflammatory or infectious colitis. 2. Trace pelvic free fluid, likely reactive. Electronically Signed   By: Ozell Daring  M.D.   On: 08/29/2023 14:39   CT ABDOMEN PELVIS W CONTRAST Result Date: 08/28/2023 CLINICAL DATA:  Abdominal pain, rectal bleeding EXAM: CT ABDOMEN AND PELVIS WITH CONTRAST TECHNIQUE: Multidetector CT imaging of the abdomen and pelvis was performed using the standard protocol following bolus  administration of intravenous contrast. RADIATION DOSE REDUCTION: This exam was performed according to the departmental dose-optimization program which includes automated exposure control, adjustment of the mA and/or kV according to patient size and/or use of iterative reconstruction technique. CONTRAST:  OMNIPAQUE  IOHEXOL  300 MG/ML  SOLN COMPARISON:  None Available. FINDINGS: Lower chest: No acute finding Hepatobiliary: No focal hepatic abnormality. Gallbladder unremarkable. Pancreas: No focal abnormality or ductal dilatation. Spleen: No focal abnormality.  Normal size. Adrenals/Urinary Tract: No adrenal abnormality. No focal renal abnormality. No stones or hydronephrosis. Urinary bladder is unremarkable. Stomach/Bowel: Diffuse colonic wall thickening and surrounding inflammation compatible with pancolitis. Stomach and small bowel decompressed. No bowel obstruction. Vascular/Lymphatic: No evidence of aneurysm or adenopathy. Reproductive: No visible focal abnormality. Other: No free fluid or free air. Musculoskeletal: No acute bony abnormality. IMPRESSION: Diffuse colonic wall thickening and surrounding inflammation compatible with infectious or inflammatory colitis. Electronically Signed   By: Franky Crease M.D.   On: 08/28/2023 23:22   DG Chest 2 View Result Date: 08/17/2023 CLINICAL DATA:  Worsening cough EXAM: CHEST - 2 VIEW COMPARISON:  Chest x-ray May 05, 2021 FINDINGS: The heart size and mediastinal contours are within normal limits. Mild increased interstitial markings of lung fields otherwise no suspicious consolidation or nodule. No pleural effusion or pneumothorax. The visualized skeletal structures are unremarkable. IMPRESSION: Mild increased interstitial marking of both lung fields. No consolidation. Electronically Signed   By: Megan  Zare M.D.   On: 08/17/2023 16:52    Microbiology: Results for orders placed or performed during the hospital encounter of 08/28/23  Blood culture (routine x  2)     Status: None (Preliminary result)   Collection Time: 08/29/23 12:53 AM   Specimen: BLOOD  Result Value Ref Range Status   Specimen Description   Final    BLOOD LEFT ANTECUBITAL Performed at [facility], [address], [place], [place] [address]    Special Requests   Final    BOTTLES DRAWN AEROBIC AND ANAEROBIC Blood Culture adequate volume Performed at [facility], [address] [place], [place] [address]    Culture   Final    NO GROWTH 2 DAYS Performed at [facility], [address], [place], [place] [address]    Report Status PENDING  Incomplete  Gastrointestinal Panel by PCR , Stool     Status: None   Collection Time: 08/29/23  6:34 PM   Specimen: STOOL  Result Value Ref Range Status   Campylobacter species NOT DETECTED NOT DETECTED Final   Plesimonas shigelloides NOT DETECTED NOT DETECTED Final   Salmonella species NOT DETECTED NOT DETECTED Final   Yersinia enterocolitica NOT DETECTED NOT DETECTED Final   Vibrio species NOT DETECTED NOT DETECTED Final   Vibrio cholerae NOT DETECTED NOT DETECTED Final   Enteroaggregative E coli (EAEC) NOT DETECTED NOT DETECTED Final   Enteropathogenic E coli (EPEC) NOT DETECTED NOT DETECTED Final   Enterotoxigenic E coli (ETEC) NOT DETECTED NOT DETECTED Final   Shiga like toxin producing E coli (STEC) NOT DETECTED NOT DETECTED Final   Shigella/Enteroinvasive E coli (EIEC) NOT DETECTED NOT DETECTED Final   Cryptosporidium NOT DETECTED NOT DETECTED Final   Cyclospora cayetanensis NOT DETECTED NOT DETECTED Final  Entamoeba histolytica NOT DETECTED NOT DETECTED Final   Giardia lamblia NOT DETECTED NOT DETECTED Final   Adenovirus F40/41 NOT DETECTED NOT DETECTED Final   Astrovirus NOT DETECTED NOT DETECTED Final   Norovirus GI/GII NOT DETECTED NOT DETECTED Final   Rotavirus A NOT DETECTED NOT DETECTED Final   Sapovirus (I, II, IV, and V) NOT DETECTED NOT DETECTED Final    Comment:  Performed at [facility], [address], [place], [place] [address]  C Difficile Quick Screen w PCR reflex     Status: None   Collection Time: 08/29/23  6:34 PM   Specimen: STOOL  Result Value Ref Range Status   C Diff antigen NEGATIVE NEGATIVE Final   C Diff toxin NEGATIVE NEGATIVE Final   C Diff interpretation No C. difficile detected.  Final    Comment: Performed at [facility], [address], [place], [place] [address]    Labs: CBC: Recent Labs  Lab 08/28/23 2111 08/29/23 0428 08/29/23 1236 08/30/23 0508 08/31/23 0342  WBC 12.7* 12.2*  --  8.7 4.1  NEUTROABS 10.9*  --   --   --   --   HGB 18.1* 17.0 17.2* 15.7 13.8  HCT 51.4 48.7 49.2 44.5 38.8*  MCV 86.2 88.4  --  89.2 88.4  PLT 193 180  --  144* 129*   Basic Metabolic Panel: Recent Labs  Lab 08/28/23 2111 08/29/23 0428 08/30/23 0508 08/31/23 0342  NA 137  --  135 132*  K 4.6  --  3.3* 3.2*  CL 99  --  101 103  CO2 26  --  26 24  GLUCOSE 282*  --  198* 188*  BUN 9  --  11 9  CREATININE 0.85 0.54* 0.76 0.76  CALCIUM 10.1  --  8.4* 7.9*   Liver Function Tests: Recent Labs  Lab 08/28/23 2111  AST 18  ALT 33  ALKPHOS 98  BILITOT 2.4*  PROT 8.6*  ALBUMIN 4.6   CBG: Recent Labs  Lab 08/30/23 1742 08/31/23 0006 08/31/23 0517 08/31/23 0749 08/31/23 1136  GLUCAP 139* 137* 155* 144* 203*    Discharge time spent: 35 minutes.  Signed: Concepcion Riser, MD [facility] 08/31/2023

## 2023-08-31 NOTE — Discharge Instructions (Signed)
  Financial Assistance by  Engineer, drilling [employer]) Next Steps: Apply on their website , [employer]  Serves your state  Open Now : 9:00 AM - 5:00 PM EDT  Eligibility: You must be employed in food service [employer], catering, concessions, food trucks, cafeterias, bars and taprooms). Must be in a crisis due to illness, injury, death of an immediate family member, OR a Chemical engineer (flood, fires, storms, Catering manager.) Availability: available Description: Geographical information systems officer serves food service workers in crisis.  This program provides:- Firefighter assistance is awarded based on the food service worker's financial need as well as a set of criteria according to the type of crisis. In order to qualify, a client must have experienced an illness, injury, death of an immediate family member (parent, grandparent, child, sibling, spouse), or a housing disaster like a flood or fire in the last six months. The food service worker must have been employed in food service at the time of crisis. We consider the following types of employers: restaurants, concessions, cafeterias, catering companies, food trucks and bars/taprooms.  Food service workers can apply for assistance for up to six months after a crisis occurs. Once [facility] receives a completed application and employment verification, the average time to process the application is one week.  Cost: Free Next Steps: Apply on their website , [employer] Website:  [email or web address] Facebook:  [email or web address]://[email or web address]/ Twitter:  [email or web address].com/@givingkitchen  Documents Required Proof of Income:Current paycheck stub. Proof of Residency:Lease or Mortgage Certification Other Documents:Statement of need.  Crisis documentation.  Current utility bills. Giving Kitchen Admin-Only Location Phone: [phone] Notes: Please  note, clients are not seen at the office. All intake and assistance is offered online and/or through the phone. Open Now: 09:00 AM - 05:00 PM EDT Sunday: Closed Monday: 9:00 AM - 5:00 PM EDT Tuesday: 9:00 AM - 5:00 PM EDT Wednesday: 9:00 AM - 5:00 PM EDT Thursday: 9:00 AM - 5:00 PM EDT Friday: 9:00 AM - 5:00 PM EDT Saturday: Closed

## 2023-08-31 NOTE — Progress Notes (Addendum)
 Pt discharged. Spouse at bedside. AVS printed. Medications, follow up appointments, and education reviewed. Midline removed. Tolerated well. Denies pain. Up ab lib. Pt transferred off unit in wheelchair with staff. Pt ready for discharge. Resources provided by Child psychotherapist. All questions asked and answered.

## 2023-09-01 ENCOUNTER — Telehealth: Payer: Self-pay

## 2023-09-01 LAB — NOROVIRUS GROUP 1 & 2 BY PCR, STOOL
Norovirus 1 by PCR: NEGATIVE
Norovirus 2  by PCR: NEGATIVE

## 2023-09-01 NOTE — Transitions of Care (Post Inpatient/ED Visit) (Signed)
   09/01/2023  Name: Leslie Langille Driscoll MRN: [id number] DOB: 27-May-1960  Today's TOC FU Call Status: Today's TOC FU Call Status:: Unsuccessful Call (1st Attempt) Unsuccessful Call (1st Attempt) Date: 09/01/23  Attempted to reach the patient regarding the most recent Inpatient/ED visit.  Follow Up Plan: Additional outreach attempts will be made to reach the patient to complete the Transitions of Care (Post Inpatient/ED visit) call.   Arvin Seip RN, BSN, CCM [employer], Population Health Case Manager Phone: [phone]

## 2023-09-02 ENCOUNTER — Telehealth: Payer: Self-pay | Admitting: *Deleted

## 2023-09-02 ENCOUNTER — Ambulatory Visit: Admitting: Podiatry

## 2023-09-02 DIAGNOSIS — Z599 Problem related to housing and economic circumstances, unspecified: Secondary | ICD-10-CM

## 2023-09-02 LAB — O&P RESULT

## 2023-09-02 LAB — OVA + PARASITE EXAM

## 2023-09-02 NOTE — Transitions of Care (Post Inpatient/ED Visit) (Signed)
   09/02/2023  Name: Raymond Lewis MRN: [id number] DOB: Jan 26, 1961  Today's TOC FU Call Status: Today's TOC FU Call Status:: Successful TOC FU Call Completed TOC FU Call Complete Date: 09/02/23 Patient's Name and Date of Birth confirmed.  Transition Care Management Follow-up Telephone Call Date of Discharge: 08/31/23 Discharge Facility: [facility]) Type of Discharge: Inpatient Admission Primary Inpatient Discharge Diagnosis:: Acute colitis How have you been since you were released from the hospital?: Better Any questions or concerns?: No  Items Reviewed: Did you receive and understand the discharge instructions provided?: Yes Medications obtained,verified, and reconciled?: Yes (Medications Reviewed) Any new allergies since your discharge?: No Dietary orders reviewed?: Yes Type of Diet Ordered:: low sodium heart healthy, carb modified Do you have support at home?: Yes People in Home [RPT]: spouse Name of Support/Comfort Primary Source: [name]  Medications Reviewed Today: Medications Reviewed Today     Reviewed by Lucky Andrea LABOR, RN (Registered Nurse) on 09/02/23 at 1131  Med List Status: <None>   Medication Order Taking? Sig Documenting Provider Last Dose Status Informant  ciprofloxacin  (CIPRO ) 500 MG tablet [id number] Yes Take 1 tablet (500 mg total) by mouth 2 (two) times daily for 5 days. Darci Pore, MD  Active   cyanocobalamin  (VITAMIN B12) 1000 MCG tablet [id number] Yes Take 1 tablet (1,000 mcg total) by mouth daily. Oley Bascom RAMAN, NP  Active Self, Pharmacy Records  metroNIDAZOLE  (FLAGYL ) 500 MG tablet [id number] Yes Take 1 tablet (500 mg total) by mouth 2 (two) times daily for 5 days. Darci Pore, MD  Active             Home Care and Equipment/Supplies: Were Home Health Services Ordered?: No Any new equipment or medical supplies ordered?: No  Functional Questionnaire: Do you need assistance with bathing/showering or dressing?:  No Do you need assistance with meal preparation?: No Do you need assistance with eating?: No Do you have difficulty maintaining continence: No Do you need assistance with getting out of bed/getting out of a chair/moving?: No Do you have difficulty managing or taking your medications?: No  Follow up appointments reviewed: PCP Follow-up appointment confirmed?: Yes Date of PCP follow-up appointment?: 09/07/23 Follow-up Provider: T. Oley, NP Specialist Hospital Follow-up appointment confirmed?: No Reason Specialist Follow-Up Not Confirmed: Patient has Specialist Provider Number and will Call for Appointment Do you need transportation to your follow-up appointment?: No Do you understand care options if your condition(s) worsen?: Yes-patient verbalized understanding  SDOH Interventions Today    Flowsheet Row Most Recent Value  SDOH Interventions   Food Insecurity Interventions Intervention Not Indicated  Housing Interventions Intervention Not Indicated  Transportation Interventions Payor Benefit  [Provided patient with [employer] transportation [phone]]  Utilities Interventions Intervention Not Indicated    Andrea Lucky RN, BSN   Value-Based Care Institute [facility] Health RN Care Manager [phone]

## 2023-09-03 LAB — CULTURE, BLOOD (ROUTINE X 2)
Culture: NO GROWTH
Special Requests: ADEQUATE

## 2023-09-05 ENCOUNTER — Ambulatory Visit: Admitting: Nurse Practitioner

## 2023-09-05 ENCOUNTER — Encounter: Payer: Self-pay | Admitting: Nurse Practitioner

## 2023-09-05 DIAGNOSIS — E1149 Type 2 diabetes mellitus with other diabetic neurological complication: Secondary | ICD-10-CM | POA: Diagnosis not present

## 2023-09-05 NOTE — Progress Notes (Signed)
 Subjective   Patient ID: Raymond Lewis, male    DOB: 05-Aug-1960, 63 y.o.   MRN: [id number]  Chief Complaint  Patient presents with   Hospitalization Follow-up    Referring provider: Oley Raymond RAMAN, NP  Raymond Lewis is a 63 y.o. male with Past Medical History: No date: Allergy No date: COVID-19 No date: Depression No date: Pneumonia  Recent significant events:  Hospital Admission: 08/28/23  Assessment and Plan: Acute colitis-infectious vs inflammatory: Bright red blood per rectum due to his severe colitis. H&H remained stable. Stool studies reviewed, unremarkable. GI evaluation and follow-up appreciated.  He will need outpatient colonoscopy in 4 to 6 weeks. Continue 5-7 days of antibiotics, script for Cipro +flagyl  sent to pharmacy. He is able to tolerate diet well, bowel movements loose but pain less, non bloody. He wishes to go home with PCP and GI follow up as scheduled.   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 not on any meds at home. Claims diabetes started since he had Covid and was on steroids. He does not wish to start any DM meds, he wishes to talk to PCP regarding management. He understands  Continue accucheks, sliding scale for now.   Hypokalemia- Replaced. Outpatient BMP follow up in 1 week.   HPI  Patient presents today for hospital follow-up.  Please see notes above.  Overall patient is improving.  He is continuing to complete antibiotics at home.  He has not made a follow-up visit with GI but does plan to call them to set up the appointment.  Patient's diabetes is uncontrolled.  We will have pharmacy to follow for medication management.  Patient refuses to take medications at this time.  He does complain today of left ear cerumen impaction.  We will perform ear wash in office today. Denies f/c/s, n/v/d, hemoptysis, PND, leg swelling Denies chest pain or edema      Allergies  Allergen Reactions   Percocet  [Oxycodone -Acetaminophen ] Other (See Comments)    DIAPHORETIC & NAUSEA     There is no immunization history on file for this patient.  Tobacco History: Social History   Tobacco Use  Smoking Status Never  Smokeless Tobacco Never   Counseling given: Not Answered   Outpatient Encounter Medications as of 09/05/2023  Medication Sig   ciprofloxacin  (CIPRO ) 500 MG tablet Take 1 tablet (500 mg total) by mouth 2 (two) times daily for 5 days.   cyanocobalamin  (VITAMIN B12) 1000 MCG tablet Take 1 tablet (1,000 mcg total) by mouth daily.   metroNIDAZOLE  (FLAGYL ) 500 MG tablet Take 1 tablet (500 mg total) by mouth 2 (two) times daily for 5 days.   No facility-administered encounter medications on file as of 09/05/2023.    Review of Systems  Review of Systems  Constitutional: Negative.   HENT: Negative.    Cardiovascular: Negative.   Gastrointestinal: Negative.   Allergic/Immunologic: Negative.   Neurological: Negative.   Psychiatric/Behavioral: Negative.       Objective:   BP (!) 143/90   Pulse 88   Temp 98.6 F (37 C) (Oral)   Wt 209 lb 9.6 oz (95.1 kg)   SpO2 100%   BMI 27.28 kg/m   Wt Readings from Last 5 Encounters:  09/05/23 209 lb 9.6 oz (95.1 kg)  08/28/23 201 lb (91.2 kg)  06/03/23 208 lb (94.3 kg)  05/18/23 207 lb (93.9 kg)  05/05/21 205 lb (93 kg)     Physical Exam Vitals and nursing note reviewed.  Constitutional:  General: He is not in acute distress.    Appearance: He is well-developed.  Cardiovascular:     Rate and Rhythm: Normal rate and regular rhythm.  Pulmonary:     Effort: Pulmonary effort is normal.     Breath sounds: Normal breath sounds.  Skin:    General: Skin is warm and dry.  Neurological:     Mental Status: He is alert and oriented to person, place, and time.       Assessment & Plan:   Type II diabetes mellitus with neurological manifestations (HCC) -     Microalbumin / creatinine urine ratio -     AMB Referral VBCI Care  Management -     Basic metabolic panel with GFR     Return in about 3 months (around 12/06/2023).   Raymond GORMAN Borer, NP 09/05/2023

## 2023-09-06 ENCOUNTER — Telehealth: Payer: Self-pay | Admitting: *Deleted

## 2023-09-06 LAB — MICROALBUMIN / CREATININE URINE RATIO
Creatinine, Urine: 81 mg/dL
Microalb/Creat Ratio: 4 mg/g{creat} (ref 0–29)
Microalbumin, Urine: 3.6 ug/mL

## 2023-09-06 LAB — BASIC METABOLIC PANEL WITH GFR
BUN/Creatinine Ratio: 9 — ABNORMAL LOW (ref 10–24)
BUN: 8 mg/dL (ref 8–27)
CO2: 24 mmol/L (ref 20–29)
Calcium: 9.7 mg/dL (ref 8.6–10.2)
Chloride: 99 mmol/L (ref 96–106)
Creatinine, Ser: 0.91 mg/dL (ref 0.76–1.27)
Glucose: 208 mg/dL — ABNORMAL HIGH (ref 70–99)
Potassium: 4.1 mmol/L (ref 3.5–5.2)
Sodium: 135 mmol/L (ref 134–144)
eGFR: 96 mL/min/1.73 (ref >59)

## 2023-09-06 NOTE — Progress Notes (Signed)
 Complex Care Management Note  Care Guide Note 09/06/2023 Name: Ioan Landini MRN: [id number] DOB: 1960-10-24  Rhylan Kagel is a 63 y.o. year old male who sees Oley Bascom RAMAN, NP for primary care. I reached out to [employer] Mendel by phone today to offer complex care management services.  Mr. Attwood was given information about Complex Care Management services today including:   The Complex Care Management services include support from the care team which includes your Nurse Care Manager, Clinical Social Worker, or Pharmacist.  The Complex Care Management team is here to help remove barriers to the health concerns and goals most important to you. Complex Care Management services are voluntary, and the patient may decline or stop services at any time by request to their care team member.   Complex Care Management Consent Status: Patient agreed to services and verbal consent obtained.   Follow up plan:  ///Telephone appointment with complex care management team member scheduled for:  09/07/23  Encounter Outcome:  Patient Scheduled  Harlene Satterfield  [facility] Health  [facility], [facility] Guide  Direct Dial: [phone]  Fax [phone]

## 2023-09-06 NOTE — Progress Notes (Signed)
 Complex Care Management Note Care Guide Note  09/06/2023 Name: Candice Lunney MRN: [id number] DOB: [birth year]   Complex Care Management Outreach Attempts: An unsuccessful telephone outreach was attempted today to offer the patient information about available complex care management services.  Follow Up Plan:  Additional outreach attempts will be made to offer the patient complex care management information and services.   Encounter Outcome:  No Answer  Harlene Satterfield  [facility] Health  [facility], [facility] Guide  Direct Dial: [phone]  Fax [phone]

## 2023-09-06 NOTE — Progress Notes (Signed)
 Care Guide Pharmacy Note  09/06/2023 Name: Raymond Lewis MRN: [id number] DOB: 10/10/1960  Referred By: Oley Bascom RAMAN, NP Reason for referral: Complex Care Management (Initial outreach to schedule referral with BSW and PharmD )   Raymond Lewis is a 63 y.o. year old male who is a primary care patient of Oley Bascom RAMAN, NP.  Raymond Lewis was referred to the pharmacist for assistance related to: DMII  An unsuccessful telephone outreach was attempted today to contact the patient who was referred to the pharmacy team for assistance with medication management. Additional attempts will be made to contact the patient.  Harlene Satterfield  [facility]  Value-Based Care Institute, [facility] Guide  Direct Dial: [phone]  Fax [phone]

## 2023-09-06 NOTE — Progress Notes (Signed)
 Care Guide Pharmacy Note  09/06/2023 Name: Kaliel Bolds MRN: [id number] DOB: Dec 27, 1960  Referred By: Oley Bascom RAMAN, NP Reason for referral: Complex Care Management (Initial outreach to schedule referral with BSW and PharmD )   Lamar Taft Clites is a 63 y.o. year old male who is a primary care patient of Oley Bascom RAMAN, NP.  Lamar Taft Shuttleworth was referred to the pharmacist for assistance related to: DMII  Successful contact was made with the patient to discuss pharmacy services including being ready for the pharmacist to call at least 5 minutes before the scheduled appointment time and to have medication bottles and any blood pressure readings ready for review. The patient agreed to meet with the pharmacist via telephone visit on (date/time).8/12 300 PM   Harlene Satterfield  [facility], [facility] Guide  Direct Dial: [phone]  Fax [phone]

## 2023-09-06 NOTE — Progress Notes (Signed)
 Care Guide Pharmacy Note  09/06/2023 Name: Lorcan Shelp MRN: [id number] DOB: Sep 09, 1960  Referred By: Oley Bascom RAMAN, NP Reason for referral: Complex Care Management (Initial outreach to schedule referral with BSW and PharmD )   Lamar Taft Dargis is a 63 y.o. year old male who is a primary care patient of Oley Bascom RAMAN, NP.  Lamar Taft Wildeman was referred to the pharmacist for assistance related to: DMII  A  telephone outreach was attempted today to contact the patient who was referred to the pharmacy team for assistance with medication management. Additional attempts will be made to contact the patient.  Harlene Satterfield  [facility]  Value-Based Care Institute, [facility] Guide  Direct Dial: [phone]  Fax [phone]

## 2023-09-07 ENCOUNTER — Other Ambulatory Visit: Payer: Self-pay

## 2023-09-07 ENCOUNTER — Ambulatory Visit: Payer: Self-pay | Admitting: Nurse Practitioner

## 2023-09-07 NOTE — Patient Outreach (Signed)
 Complex Care Management   Visit Note  09/07/2023  Name:  Raymond Lewis MRN: [id number] DOB: [birth year]  Situation: Referral received for Complex Care Management related to SDOH Barriers:  Housing   Food insecurity Financial Resource Strain Education, Dentistry and Automobile care I obtained verbal consent from Patient.  Visit completed with patient  on the phone  Background:   Past Medical History:  Diagnosis Date   Allergy    COVID-19    Depression    Pneumonia     Assessment:  BSW spoke with Raymond Lewis to assess for SDOH barriers. During the assessment, BSW identified food insecurity, housing instability, and financial strain and Utilities as present concerns. BSW provided Raymond Lewis with resources for food assistance including Health and safety inspector, Bless[employer] Programme researcher, broadcasting/film/video, [employer] account executive, A New Chance on Life [employer], [employer], and [employer]. For housing and financial support, BSW sent information for [employer], [employer] of [place], [place] DSS, [address] of [place], and [employer]. Utility assistance resources included LIEAP via DSS, Airline pilot, and [employer]. Raymond Lewis shared that he had not seen a dentist in over six months and his dental care declined after recovering from COVID. BSW sent dental care resources that accept his [employer], including [employer], [place] [employer], [facility], and [employer]. Additionally, Raymond Lewis expressed interest in returning to school or completing certification programs, so BSW provided information for [employer]'s adult education and financial aid programs as well as [employer] Surveyor, minerals. Lastly, Raymond Lewis requested help with car tag and tax payments, and BSW sent resources for [employer], [facility] Social Work  Department, and [employer] tag assistance. No further needs were identified at this time, and BSW informed Raymond Lewis to reach out if additional support is needed in the future. BSW has closed the case.    SDOH Interventions Today    Flowsheet Row Most Recent Value  SDOH Interventions   Food Insecurity Interventions Community Resources Provided  Housing Interventions Community Resources Provided  Utilities Interventions Community Resources Provided  Financial Strain Interventions Community Resources Provided      Recommendation:   No recommendations at this time.  Follow Up Plan:   Patient has met all care management goals. Care Management case will be closed. Patient has been provided contact information should new needs arise.   Orlean Fey, BSW [place]  Value Based Care Institute Social Worker, [employer] Health [phone]

## 2023-09-07 NOTE — Patient Instructions (Signed)
 Visit Information  Thank you for taking time to visit with me today. Please don't hesitate to contact me if I can be of assistance to you before our next scheduled appointment.  Our next appointment is no further scheduled appointments.   Please call the care guide team at [phone] if you need to cancel or reschedule your appointment.    Please call the Suicide and Crisis Lifeline: 988 call the [place]  National Suicide Prevention Lifeline: 6020706364 or TTY: 623-840-8122 TTY 641-082-2735) to talk to a trained counselor call 1-800-273-TALK (toll free, 24 hour hotline) go to [facility] [address] [place] [id number]) call 911 if you are experiencing a Mental Health or Behavioral Health Crisis or need someone to talk to.  Patient verbalizes understanding of instructions and care plan provided today and agrees to view in MyChart. Active MyChart status and patient understanding of how to access instructions and care plan via MyChart confirmed with patient.     Haven Lion, BSW [place]  Value Based Care Institute Social Worker, [employer] Health [phone]

## 2023-09-09 ENCOUNTER — Other Ambulatory Visit: Payer: Self-pay

## 2023-09-09 NOTE — Patient Instructions (Signed)
 Visit Information  Thank you for taking time to visit with me today. Please don't hesitate to contact me if I can be of assistance to you before our next scheduled telephone appointment.  Our next appointment is by telephone on 09/16/23 at 1 pm  Following is a copy of your care plan:   Goals Addressed             This Visit's Progress    VBCI Transitions of Care (TOC) Care Plan       Problems:  Recent Hospitalization for treatment of Colitis SDOH barrier: housing and utilities  Goal:  Over the next 30 days, the patient will not experience hospital readmission  Interventions:  Transitions of Care: Doctor Visits  - discussed the importance of doctor visits Discussed the importance of follow up with [facility] Active listening and support Encouraged patient to continue to adhere to a low carbohydrate/ heart healthy diet Encouraged patient to continue mild exercise such as walking if ok with provider.  Discussed Hgb A1c Discussed importance of monitoring blood sugars at home as a form of diabetes self management.  Message sent to primary care provider informing her that GI provider she referred patient to does not accept his insurance.  Also requested prescription for glucometer and BP monitor at patients request.   Patient Self Care Activities:  Attend all scheduled provider appointments Call provider office for new concerns or questions  Notify RN Care Manager of TOC call rescheduling needs Participate in Transition of Care Program/Attend TOC scheduled calls Take medications as prescribed   Work with the social worker to address care coordination needs and will continue to work with the clinical team to address health care and disease management related needs Continue to adhere to a low carbohydrate/ heart healthy diet Continue mild low impact exercise such as walking at least 3 times a week if ok with your provider.   Plan:  Telephone follow up appointment with care management  team member scheduled for:  09/15/22 at 1 pm.         Patient verbalizes understanding of instructions and care plan provided today and agrees to view in MyChart. Active MyChart status and patient understanding of how to access instructions and care plan via MyChart confirmed with patient.     The patient has been provided with contact information for the care management team and has been advised to call with any health related questions or concerns.   Please call the care guide team at [phone] if you need to cancel or reschedule your appointment.   Please call the Suicide and Crisis Lifeline: 988 call 1-800-273-TALK (toll free, 24 hour hotline) if you are experiencing a Mental Health or Behavioral Health Crisis or need someone to talk to.  Arvin Seip RN, BSN, CCM [employer], Population Health Case Manager Phone: [phone]

## 2023-09-09 NOTE — Transitions of Care (Post Inpatient/ED Visit) (Signed)
 Transition of Care week 2  Visit Note  09/09/2023  Name: Raymond Lewis MRN: [id number]          DOB: 1960-08-30  Situation: Patient enrolled in [facility] 30-day program. Visit completed with patient by telephone.   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  He states he is using baking soda and water mixture to soak his feet 3 x a week.  Still has ongoing numbness puffiness.  HEENT HEENT Symptoms Reported: No symptoms reported      Cardiovascular Cardiovascular Symptoms Reported: No symptoms reported    Respiratory Respiratory Symptoms Reported: No symptoms reported    Endocrine Endocrine Symptoms Reported: No symptoms reported Is patient diabetic?: Yes Is patient checking blood sugars at home?: No Endocrine Comment: patient states he chooses not to take any diabetic medications at this time. He states he has made some life style changes with diet and exercise to help with bringing down his Hgb A1c.  Patient states he is willing to check his blood sugars periodically if he had a glucometer.  Gastrointestinal Gastrointestinal Symptoms Reported: No symptoms reported Additional Gastrointestinal Details: patient denies any symptoms stating he feels better.  Patient states he needs to talk with his primary care provider because the GI doctor he was referred to does not take his insurance.  This RN case manager offered to notify his primary care provider  of this and request new GI referral to in plan provider. Patient verbally agreed and voiced appreciation.  Patient reports completing his prescribed antibiotic. Gastrointestinal Management Strategies: Medication therapy (routine follow up)    Genitourinary Genitourinary Symptoms Reported: No symptoms reported    Integumentary Integumentary Symptoms Reported: No symptoms reported    Musculoskeletal Musculoskelatal Symptoms Reviewed: No symptoms reported        Psychosocial Psychosocial Symptoms Reported: No symptoms reported         There were no vitals filed for this visit.  Medications Reviewed Today     Reviewed by Kaisen Ackers E, RN (Registered Nurse) on 09/09/23 at 1201  Med List Status: <None>   Medication Order Taking? Sig Documenting Provider Last Dose Status Informant  cyanocobalamin  (VITAMIN B12) 1000 MCG tablet [id number] Yes Take 1 tablet (1,000 mcg total) by mouth daily. Oley Bascom RAMAN, NP  Active Self, Pharmacy Records            Goals Addressed             This Visit's Progress    VBCI Transitions of Care (TOC) Care Plan       Problems:  Recent Hospitalization for treatment of Colitis SDOH barrier: housing and utilities  Goal:  Over the next 30 days, the patient will not experience hospital readmission  Interventions:  Transitions of Care: Doctor Visits  - discussed the importance of doctor visits Discussed the importance of follow up with [facility] Active listening and support Encouraged patient to continue to adhere to a low carbohydrate/ heart healthy diet Encouraged patient to  continue mild exercise such as walking if ok with provider.  Discussed Hgb A1c Discussed importance of monitoring blood sugars at home as a form of diabetes self management.  Message sent to primary care provider informing her that GI provider she referred patient to does not accept his insurance.  Also requested prescription for glucometer and BP monitor at patients  request.   Patient Self Care Activities:  Attend all scheduled provider appointments Call provider office for new concerns or questions  Notify RN Care Manager of TOC call rescheduling needs Participate in Transition of Care Program/Attend TOC scheduled calls Take medications as prescribed   Work with the social worker to address care coordination needs and will continue to work with the clinical team to address health care and disease management related needs Continue to adhere to a low carbohydrate/ heart healthy diet Continue mild low impact exercise such as walking at least 3 times a week if ok with your provider.   Plan:  Telephone follow up appointment with care management team member scheduled for:  09/15/22 at 1 pm.         Recommendation:   Continue Current Plan of Care  Follow Up Plan:   Telephone follow up appointment date/time:  09/15/23 at 1 pm  Arvin Seip RN, BSN, CCM [place], Population Health Case Manager Phone: [phone]

## 2023-09-12 ENCOUNTER — Ambulatory Visit: Payer: Self-pay

## 2023-09-12 NOTE — Telephone Encounter (Signed)
 FYI Only or Action Required?: FYI only for provider.  Patient was last seen in primary care on 09/05/2023 by Oley Bascom RAMAN, NP.  Called Nurse Triage reporting No chief complaint on file..  Symptoms began yesterday.  Interventions attempted: Rest, hydration, or home remedies.  Symptoms are: unchanged.  Triage Disposition: Go to ED Now (Notify PCP)  Patient/caregiver understands and will follow disposition?: Yes Reason for Disposition  [1] SEVERE pain AND [2] age > 60 years  Answer Assessment - Initial Assessment Questions Pt requested PCP call to direct admit. This RN advised that is unlikely and that he should check in and be triaged upon arrival. States he will return to [employer].  1. LOCATION: Where does it hurt?      Right lower quadrant  2. RADIATION: Does the pain shoot anywhere else? (e.g., chest, back)     Denies  3. ONSET: When did the pain begin? (Minutes, hours or days ago)      Yesterday   4. SUDDEN: Gradual or sudden onset?     Sudden  6. SEVERITY: How bad is the pain?  (e.g., Scale 1-10; mild, moderate, or severe)     10/10 last night, slightly lower today  7. RECURRENT SYMPTOM: Have you ever had this type of stomach pain before? If Yes, ask: When was the last time? and What happened that time?      Hospital dx colitis 08/28/23  10. OTHER SYMPTOMS: Do you have any other symptoms? (e.g., back pain, diarrhea, fever, urination pain, vomiting)       Nausea and vomiting  Protocols used: Abdominal Pain - Male-A-AH Copied from CRM #[id number]. Topic: Clinical - Red Word Triage >> Sep 12, 2023 12:47 PM Raymond Lewis wrote: [facility] that prompted transfer to Nurse Triage: patient was recently discharged from hospital 08/28/23-08/31/23  for colitis- pts wife Raymond Lewis calling on his behalf  the patients symptoms have returned and is now vomiting and having back pain. Raymond wants to know if any medication can be sent in or if he needs to be seen in  office.

## 2023-09-13 ENCOUNTER — Other Ambulatory Visit: Payer: Self-pay

## 2023-09-15 ENCOUNTER — Other Ambulatory Visit (HOSPITAL_BASED_OUTPATIENT_CLINIC_OR_DEPARTMENT_OTHER): Payer: Self-pay

## 2023-09-15 ENCOUNTER — Other Ambulatory Visit: Payer: Self-pay

## 2023-09-15 NOTE — Patient Instructions (Signed)
 Raymond Lewis

## 2023-09-15 NOTE — Transitions of Care (Post Inpatient/ED Visit) (Signed)
  Transition of Care week 2  Visit Note  09/15/2023  Name: Raymond Lewis MRN: [id number]          DOB: Nov 28, 1960  Situation: Patient enrolled in [facility] 30-day program. Visit completed with patient by telephone.   Background: Admission from 08/28/23 to 08/31/23 for acute colitis  Initial Transition Care Management Follow-up Telephone Call    Past Medical History:  Diagnosis Date   Allergy    COVID-19    Depression    Pneumonia     Assessment: Patient Reported Symptoms: Cognitive Cognitive Status: Normal speech and language skills, Insightful and able to interpret abstract concepts, Alert and oriented to person, place, and time, No symptoms reported      Neurological Neurological Review of Symptoms: Numbness (in feet) Neurological Management Strategies: Routine screening (patient states he chooses not to take any medication)  HEENT HEENT Symptoms Reported: No symptoms reported      Cardiovascular Cardiovascular Symptoms Reported: No symptoms reported    Respiratory Respiratory Symptoms Reported: No symptoms reported    Endocrine Endocrine Symptoms Reported: No symptoms reported Is patient diabetic?: Yes Is patient checking blood sugars at home?: No Endocrine Comment: Patient states he is interested in checking his blood sugar however states he does not want to finger stick.  Patient advised to call his insurance carrier and ask what type/brand of diabetic monitoring device do they cover.  Gastrointestinal Gastrointestinal Symptoms Reported: No symptoms reported Additional Gastrointestinal Details: patient reports having gastrointestinal upset approximately 5 days ago.  He states he experienced vomiting and right and lower belly abdominal pain.  Patient states this lasted for just a day.  He reports having bowel movement that day as well and reports symptoms subsided. He states he fasted x 2 days then slowly started re-introducing soft/ bland foods back into his diet.  Patient  denies having any additional symptoms.      Genitourinary Genitourinary Symptoms Reported: No symptoms reported    Integumentary Integumentary Symptoms Reported: No symptoms reported    Musculoskeletal Musculoskelatal Symptoms Reviewed: No symptoms reported        Psychosocial Psychosocial Symptoms Reported: Other Other Psychosocial Conditions: patient states he experiences some days where he feels low due to his medical condition/ finances/ job.         There were no vitals filed for this visit.  Medications Reviewed Today     Reviewed by Lionardo Haze E, RN (Registered Nurse) on 09/15/23 at 1430  Med List Status: <None>   Medication Order Taking? Sig Documenting Provider Last Dose Status Informant  cyanocobalamin  (VITAMIN B12) 1000 MCG tablet [id number] Yes Take 1 tablet (1,000 mcg total) by mouth daily. Oley Bascom RAMAN, NP  Active Self, Pharmacy Records            Recommendation:   PCP Follow-up Specialty provider follow-up with gastroenterologist once referred Continue Current Plan of Care  Follow Up Plan:   Telephone follow-up in 1 week 09/20/23 at 2 pm  Arvin Seip RN, BSN, CCM [facility], Population Health Case Manager Phone: [phone]

## 2023-09-20 ENCOUNTER — Telehealth: Payer: Self-pay

## 2023-09-21 ENCOUNTER — Telehealth: Payer: Self-pay

## 2023-09-22 ENCOUNTER — Encounter: Payer: Self-pay | Admitting: *Deleted

## 2023-09-27 NOTE — Telephone Encounter (Signed)
 This encounter was created in error - please disregard.

## 2023-09-30 ENCOUNTER — Emergency Department (HOSPITAL_COMMUNITY)
Admission: EM | Admit: 2023-09-30 | Discharge: 2023-10-01 | Discharge status: Against Medical Advice | Attending: Emergency Medicine | Admitting: Emergency Medicine

## 2023-09-30 ENCOUNTER — Encounter (HOSPITAL_COMMUNITY): Payer: Self-pay

## 2023-09-30 ENCOUNTER — Other Ambulatory Visit: Payer: Self-pay

## 2023-09-30 DIAGNOSIS — R109 Unspecified abdominal pain: Secondary | ICD-10-CM | POA: Diagnosis present

## 2023-09-30 DIAGNOSIS — Z5321 Procedure and treatment not carried out due to patient leaving prior to being seen by health care provider: Secondary | ICD-10-CM | POA: Diagnosis not present

## 2023-09-30 DIAGNOSIS — R112 Nausea with vomiting, unspecified: Secondary | ICD-10-CM | POA: Diagnosis not present

## 2023-09-30 LAB — CBC
HCT: 45.7 % (ref 39.0–52.0)
Hemoglobin: 15.8 g/dL (ref 13.0–17.0)
MCH: 30.3 pg (ref 26.0–34.0)
MCHC: 34.6 g/dL (ref 30.0–36.0)
MCV: 87.5 fL (ref 80.0–100.0)
Platelets: 178 K/uL (ref 150–400)
RBC: 5.22 MIL/uL (ref 4.22–5.81)
RDW: 12.4 % (ref 11.5–15.5)
WBC: 7.2 K/uL (ref 4.0–10.5)
nRBC: 0 % (ref 0.0–0.2)

## 2023-09-30 LAB — COMPREHENSIVE METABOLIC PANEL WITH GFR
ALT: 147 U/L — ABNORMAL HIGH (ref 0–44)
AST: 192 U/L — ABNORMAL HIGH (ref 15–41)
Albumin: 4.3 g/dL (ref 3.5–5.0)
Alkaline Phosphatase: 128 U/L — ABNORMAL HIGH (ref 38–126)
Anion gap: 13 (ref 5–15)
BUN: 7 mg/dL — ABNORMAL LOW (ref 8–23)
CO2: 23 mmol/L (ref 22–32)
Calcium: 9.7 mg/dL (ref 8.9–10.3)
Chloride: 99 mmol/L (ref 98–111)
Creatinine, Ser: 0.82 mg/dL (ref 0.61–1.24)
GFR, Estimated: >60 mL/min (ref >60)
Glucose, Bld: 267 mg/dL — ABNORMAL HIGH (ref 70–99)
Potassium: 4.2 mmol/L (ref 3.5–5.1)
Sodium: 135 mmol/L (ref 135–145)
Total Bilirubin: 3.8 mg/dL — ABNORMAL HIGH (ref 0.0–1.2)
Total Protein: 7.4 g/dL (ref 6.5–8.1)

## 2023-09-30 LAB — LIPASE, BLOOD: Lipase: 193 U/L — ABNORMAL HIGH (ref 11–51)

## 2023-09-30 MED ORDER — ONDANSETRON HCL 4 MG/2ML IJ SOLN
4.0000 mg | Freq: Once | INTRAMUSCULAR | Status: AC
  Administered 2023-10-01: 4 mg via INTRAVENOUS
  Filled 2023-09-30: qty 2

## 2023-09-30 MED ORDER — ONDANSETRON 4 MG PO TBDP
4.0000 mg | ORAL_TABLET | Freq: Once | ORAL | Status: AC | PRN
  Administered 2023-09-30: 4 mg via ORAL
  Filled 2023-09-30: qty 1

## 2023-09-30 NOTE — ED Triage Notes (Signed)
 PT c/c of abd pain w/ N/V. PT was seen a few weeks ago here was told to follow up with GI PT attempted to schedule an appointment but the referred Dr didn't accept his insurance. Waiting to find new GI Dr.

## 2023-10-01 ENCOUNTER — Emergency Department (HOSPITAL_COMMUNITY)

## 2023-10-01 NOTE — ED Provider Notes (Signed)
 Left AMA prior to EDP evaluation    Cresencio Reesor, MD 10/01/23 9861

## 2023-10-04 ENCOUNTER — Telehealth: Payer: Self-pay

## 2023-10-04 NOTE — Transitions of Care (Post Inpatient/ED Visit) (Signed)
   10/04/2023  Name: Raymond Lewis MRN: [id number] DOB: 12-21-1960  Today's TOC FU Call Status:   Patient's Name and Date of Birth confirmed.  Transition Care Management Follow-up Telephone Call Date of Discharge: 10/01/23 Discharge Facility: [facility]) Type of Discharge: Emergency Department How have you been since you were released from the hospital?: Better Any questions or concerns?: No  Items Reviewed: Did you receive and understand the discharge instructions provided?: Yes Medications obtained,verified, and reconciled?: No Any new allergies since your discharge?: No Dietary orders reviewed?: NA Do you have support at home?: Yes People in Home [RPT]: spouse Name of Support/Comfort Primary Source: Wife  Medications Reviewed Today: Medications Reviewed Today     Reviewed by Raymond Lewis, CMA (Certified Medical Assistant) on 10/04/23 at 1211  Med List Status: <None>   Medication Order Taking? Sig Documenting Provider Last Dose Status Informant  cyanocobalamin  (VITAMIN B12) 1000 MCG tablet [id number] Yes Take 1 tablet (1,000 mcg total) by mouth daily. Oley Bascom RAMAN, NP  Active Self, Pharmacy Records            Home Care and Equipment/Supplies: Were Home Health Services Ordered?: NA Any new equipment or medical supplies ordered?: NA  Functional Questionnaire: Do you need assistance with bathing/showering or dressing?: No Do you need assistance with meal preparation?: No Do you need assistance with eating?: No Do you have difficulty maintaining continence: No Do you need assistance with getting out of bed/getting out of a chair/moving?: No Do you have difficulty managing or taking your medications?: No  Follow up appointments reviewed: PCP Follow-up appointment confirmed?: Yes Date of PCP follow-up appointment?: 10/10/23 Specialist Hospital Follow-up appointment confirmed?: NA Do you need transportation to your follow-up appointment?: No Do you  understand care options if your condition(s) worsen?: Yes-patient verbalized understanding    SIGNATURE Raymond Lewis, RMA

## 2023-10-06 ENCOUNTER — Other Ambulatory Visit

## 2023-10-06 ENCOUNTER — Other Ambulatory Visit: Payer: Self-pay

## 2023-10-06 ENCOUNTER — Ambulatory Visit: Admitting: Podiatry

## 2023-10-06 ENCOUNTER — Telehealth: Payer: Self-pay

## 2023-10-06 NOTE — Telephone Encounter (Signed)
 Attempted to contact patient for scheduled appointment for medication management. VM box full. Will reach out via MyChart.  Lorain Baseman, PharmD [facility] [phone]

## 2023-10-06 NOTE — Progress Notes (Deleted)
 10/06/2023 Name: Raymond Lewis MRN: [id number] DOB: Dec 08, 1960  No chief complaint on file.   Raymond Lewis is a 63 y.o. year old male who presented for a telephone visit.   They were referred to the pharmacist by their PCP for assistance in managing diabetes and hypertension. PMH includes T2DM, HTN, hx of acute respiratory failure with hypoxia, hx of acute colitis (hospitalization in July 2025)   Subjective: Patient was last seen by PCP, Bascom Borer, NP, on 09/05/23. At last visit, BP was elevated to 143/90 mmHg. Patient refused medications for diabetes, despite last A1C of 9.5%. Patient presented to the ED on 09/30/23 for abdominal pain with nausea/vomiting. He left AMA before he could undergo further evaluation.   Today, patient presents in *** good spirits and presents without *** any assistance. ***Patient is accompanied by ***.    Care Team: Primary Care Provider: Borer Bascom RAMAN, NP ; Next Scheduled Visit: 10/10/23 for hospital follow-up   Medication Access/Adherence  Current Pharmacy:  [id number] [place] [facility] [place] [place] - [address] AT [facility] OF [address] & [address] [place] [address] Phone: [phone] Fax: [phone]  [facility] [address] [place] [address] Phone: [phone] Fax: [phone]  [facility] #[id number] - [place], [place] - [address] AT [facility] OF [address] [place] [address] Phone: [phone] Fax: [phone]   Patient reports affordability concerns with their medications: {YES/NO:21197} Patient reports access/transportation concerns to their pharmacy: {YES/NO:21197} Patient reports adherence concerns with their medications:  {YES/NO:21197} ***  *** Patient denies adherence with medications, reports missing *** medications *** times per week, on average.   Diabetes:  Current medications:  *** Medications tried in the past: ***  Current glucose readings: ***  Date of Download: *** % Time CGM is active: ***% Average Glucose: *** mg/dL Glucose Management Indicator: ***  Glucose Variability: *** (goal <36%) Time in Goal:  - Time in range 70-180: ***% - Time above range: ***% - Time below range: ***% Observed patterns:  Patient {Actions; denies-reports:120008} hypoglycemic s/sx including ***dizziness, shakiness, sweating. Patient {Actions; denies-reports:120008} hyperglycemic symptoms including ***polyuria, polydipsia, polyphagia, nocturia, neuropathy, blurred vision.  Current meal patterns:  - Breakfast: *** - Lunch *** - Supper *** - Snacks *** - Drinks ***  Current physical activity: ***  Current medication access support: ***  Macrovascular and Microvascular Risk Reduction:  Statin? {DM Statin Pharmacy:33491}; ACEi/ARB? {DM ACEi/ARB pharmacy:33492} Last urinary albumin/creatinine ratio:  Lab Results  Component Value Date   MICRALBCREAT 4 09/05/2023   Last eye exam:   Last foot exam: No foot exam found Tobacco Use:  Tobacco Use: Low Risk  (09/30/2023)   Patient History    Smoking Tobacco Use: Never    Smokeless Tobacco Use: Never    Passive Exposure: Not on file     Objective:  BP Readings from Last 3 Encounters:  09/30/23 (!) 156/86  09/05/23 (!) 143/90  08/31/23 (!) 157/85    Lab Results  Component Value Date   HGBA1C 9.5 (H) 08/29/2023   HGBA1C 9.0 (A) 06/03/2023   HGBA1C 6.8 (A) 03/27/2020       Latest Ref Rng & Units 09/30/2023    9:37 PM 09/05/2023   11:51 AM 08/31/2023    3:42 AM  BMP  Glucose 70 - 99 mg/dL 732  791  811   BUN 8 - 23 mg/dL 7  8  9   Creatinine 0.61 - 1.24 mg/dL 9.17  9.08  9.23   BUN/Creat Ratio 10 - 24  9    Sodium 135 - 145 mmol/L 135  135  132   Potassium 3.5 - 5.1 mmol/L 4.2  4.1  3.2   Chloride 98 - 111 mmol/L 99  99  103   CO2 22 - 32 mmol/L 23  24  24    Calcium 8.9 - 10.3 mg/dL 9.7  9.7  7.9      Lab Results  Component Value Date   CHOL 154 06/03/2023   HDL 38 (L) 06/03/2023   LDLCALC 85 06/03/2023   TRIG 181 (H) 06/03/2023   CHOLHDL 4.1 06/03/2023    Medications Reviewed Today   Medications were not reviewed in this encounter       Assessment/Plan:   Diabetes: - Currently {CHL Controlled/Uncontrolled:[phone]} with most recent A1C of *** {Desc; above/below:16086} goal {CCM A1c HNJOD:[id number]}. Medication adherence appears ***. Control is suboptimal due to ***.   - Last UACR *** - Patient denies personal or family history of multiple endocrine neoplasia type 2, medullary thyroid cancer; personal history of pancreatitis or gallbladder disease.*** - Reviewed long term cardiovascular and renal outcomes of uncontrolled blood sugar - Reviewed goal A1c, goal fasting, and goal 2 hour post prandial glucose - Reviewed hypoglycemia management plan and the rule of 15*** - Reviewed dietary modifications including *** utilizing the healthy plate method, limiting portion size of carbohydrate foods, increasing intake of protein and non-starchy vegetables. Counseled patient to stay hydrated with water throughout the day. - Reviewed lifestyle modifications including: aiming for 150 minutes of moderate intensity exercise every week. *** - Recommend to ***  - Recommend to check glucose twice daily: fasting and 2-hr PPG ***. Counseled patient to bring glucometer or BG log to every appointment. - Next A1C due ***    Meets financial criteria for *** patient assistance program through ***. Will collaborate with provider, CPhT, and patient to pursue assistance.     Written patient instructions provided. Patient verbalized understanding of treatment plan.   Follow Up Plan:  Pharmacist *** PCP clinic visit i9/8/25   Lorain Baseman, PharmD [facility] [phone]

## 2023-10-10 ENCOUNTER — Inpatient Hospital Stay: Payer: Self-pay | Admitting: Nurse Practitioner

## 2023-10-17 ENCOUNTER — Encounter: Payer: Self-pay | Admitting: Nurse Practitioner

## 2023-10-17 ENCOUNTER — Other Ambulatory Visit: Payer: Self-pay

## 2023-10-17 ENCOUNTER — Ambulatory Visit: Payer: Self-pay | Admitting: Nurse Practitioner

## 2023-10-17 VITALS — BP 153/93 | HR 77 | Temp 98.1°F | Wt 208.0 lb

## 2023-10-17 DIAGNOSIS — Z9189 Other specified personal risk factors, not elsewhere classified: Secondary | ICD-10-CM | POA: Diagnosis not present

## 2023-10-17 DIAGNOSIS — K922 Gastrointestinal hemorrhage, unspecified: Secondary | ICD-10-CM

## 2023-10-17 DIAGNOSIS — M79672 Pain in left foot: Secondary | ICD-10-CM

## 2023-10-17 DIAGNOSIS — M79671 Pain in right foot: Secondary | ICD-10-CM

## 2023-10-17 DIAGNOSIS — I10 Essential (primary) hypertension: Secondary | ICD-10-CM

## 2023-10-17 DIAGNOSIS — E1149 Type 2 diabetes mellitus with other diabetic neurological complication: Secondary | ICD-10-CM | POA: Diagnosis not present

## 2023-10-17 MED ORDER — LANCETS MISC. MISC: 1.0000 | Freq: Three times a day (TID) | 0 refills | Status: AC

## 2023-10-17 MED ORDER — BLOOD GLUCOSE TEST VI STRP
ORAL_STRIP | 0 refills | Status: AC
  Filled 2023-10-18: qty 100, 30d supply, fill #0

## 2023-10-17 MED ORDER — BLOOD GLUCOSE MONITOR SYSTEM W/DEVICE KIT
PACK | 0 refills | Status: AC
  Filled 2023-10-18: qty 1, 30d supply, fill #0

## 2023-10-17 MED ORDER — BLOOD PRESSURE KIT: 1.0000 [IU] | PACK | Freq: Every day | 0 refills | Status: AC

## 2023-10-17 MED ORDER — LANCET DEVICE MISC: 1.0000 | Freq: Three times a day (TID) | 0 refills | Status: AC

## 2023-10-17 NOTE — Progress Notes (Signed)
 Subjective   Patient ID: Raymond Lewis, male    DOB: September 25, 1960, 63 y.o.   MRN: [id number]  Chief Complaint  Patient presents with   Hospitalization Follow-up    Referring provider: Oley Bascom RAMAN, NP  Raymond Lewis is a 63 y.o. male with Past Medical History: No date: Allergy No date: COVID-19 No date: Depression No date: Pneumonia   HPI  Patient presents today for an ED follow-up visit.  He went to the ED on 09/30/2023 for GI upset.  He states that this is now resolved.  Patient is requesting a referral today to physical therapy for bilateral foot pain.  He also would like a referral to a dentist.  Patient also needs a referral to GI for colon cancer screening. Denies f/c/s, n/v/d, hemoptysis, PND, leg swelling Denies chest pain or edema    Allergies  Allergen Reactions   Percocet [Oxycodone -Acetaminophen ] Other (See Comments)    DIAPHORETIC & NAUSEA     There is no immunization history on file for this patient.  Tobacco History: Social History   Tobacco Use  Smoking Status Never  Smokeless Tobacco Never   Counseling given: Not Answered   Outpatient Encounter Medications as of 10/17/2023  Medication Sig   Blood Glucose Monitoring Suppl (BLOOD GLUCOSE MONITOR SYSTEM) w/Device KIT USE AS DIRECTED TO TEST BLOOD SUGARS IN THE MORNING, NOON, AND AT BEDTIME   Blood Pressure KIT 1 Units by Does not apply route daily.   cyanocobalamin  (VITAMIN B12) 1000 MCG tablet Take 1 tablet (1,000 mcg total) by mouth daily.   Glucose Blood (BLOOD GLUCOSE TEST STRIPS) STRP USE AS DIRECTED TO TEST BLOOD SUGARS IN THE MORNING, NOON, AND AT BEDTIME   Lancet Device MISC 1 each by Does not apply route in the morning, at noon, and at bedtime. May substitute to any manufacturer covered by patient's insurance.   Lancets Misc. MISC 1 each by Does not apply route in the morning, at noon, and at bedtime. May substitute to any manufacturer covered by patient's insurance.   No  facility-administered encounter medications on file as of 10/17/2023.    Review of Systems  Review of Systems  Constitutional: Negative.   HENT: Negative.    Cardiovascular: Negative.   Gastrointestinal: Negative.   Allergic/Immunologic: Negative.   Neurological: Negative.   Psychiatric/Behavioral: Negative.       Objective:   BP (!) 153/93   Pulse 77   Temp 98.1 F (36.7 C) (Oral)   Wt 208 lb (94.3 kg)   SpO2 100%   BMI 27.07 kg/m   Wt Readings from Last 5 Encounters:  10/17/23 208 lb (94.3 kg)  09/30/23 201 lb (91.2 kg)  09/05/23 209 lb 9.6 oz (95.1 kg)  08/28/23 201 lb (91.2 kg)  06/03/23 208 lb (94.3 kg)     Physical Exam Vitals and nursing note reviewed.  Constitutional:      General: He is not in acute distress.    Appearance: He is well-developed.  Cardiovascular:     Rate and Rhythm: Normal rate and regular rhythm.  Pulmonary:     Effort: Pulmonary effort is normal.     Breath sounds: Normal breath sounds.  Skin:    General: Skin is warm and dry.  Neurological:     Mental Status: He is alert and oriented to person, place, and time.       Assessment & Plan:   Bilateral foot pain -     Ambulatory referral to Physical Therapy  Need for dental care -     Ambulatory referral to Dentistry  Gastrointestinal hemorrhage, unspecified gastrointestinal hemorrhage type -     Ambulatory referral to Gastroenterology  Type II diabetes mellitus with neurological manifestations (HCC) -     Blood Glucose Monitor System; USE AS DIRECTED TO TEST BLOOD SUGARS IN THE MORNING, NOON, AND AT BEDTIME  Dispense: 1 kit; Refill: 0 -     Blood Glucose Test; USE AS DIRECTED TO TEST BLOOD SUGARS IN THE MORNING, NOON, AND AT BEDTIME  Dispense: 100 each; Refill: 0 -     Lancet Device; 1 each by Does not apply route in the morning, at noon, and at bedtime. May substitute to any manufacturer covered by patient's insurance.  Dispense: 1 each; Refill: 0 -     Lancets Misc.; 1  each by Does not apply route in the morning, at noon, and at bedtime. May substitute to any manufacturer covered by patient's insurance.  Dispense: 100 each; Refill: 0  Primary hypertension -     Blood Pressure; 1 Units by Does not apply route daily.  Dispense: 1 kit; Refill: 0     Return in about 3 months (around 01/16/2024).   Bascom GORMAN Borer, NP 10/31/2023

## 2023-10-18 ENCOUNTER — Other Ambulatory Visit: Payer: Self-pay

## 2023-10-18 MED ORDER — ACCU-CHEK SOFTCLIX LANCETS MISC
0 refills | Status: AC
  Filled 2023-10-18: qty 100, 30d supply, fill #0

## 2023-10-19 ENCOUNTER — Other Ambulatory Visit: Payer: Self-pay

## 2023-10-20 ENCOUNTER — Telehealth: Payer: Self-pay | Admitting: *Deleted

## 2023-10-20 NOTE — Progress Notes (Signed)
 Complex Care Management Care Guide Note  10/20/2023 Name: Raymond Lewis MRN: [id number] DOB: 11-Feb-1960  Raymond Lewis is a 63 y.o. year old male who is a primary care patient of Oley Bascom RAMAN, NP and is actively engaged with the care management team. I reached out to [employer] by phone today to assist with re-scheduling  with the Pharmacist.  Follow up plan: Successful telephone outreach attempt made patient would like more information prior to scheduling call with PharmD. Spoke with Layna she will call patient and follow up with Tonya NP.  Harlene Satterfield  [facility]  [facility], [facility] Guide  Direct Dial: [phone]  Fax [phone]

## 2023-10-26 ENCOUNTER — Other Ambulatory Visit: Payer: Self-pay

## 2023-10-31 ENCOUNTER — Encounter: Payer: Self-pay | Admitting: Nurse Practitioner

## 2023-11-08 ENCOUNTER — Other Ambulatory Visit (INDEPENDENT_AMBULATORY_CARE_PROVIDER_SITE_OTHER): Payer: Self-pay

## 2023-11-08 DIAGNOSIS — E1165 Type 2 diabetes mellitus with hyperglycemia: Secondary | ICD-10-CM

## 2023-11-08 NOTE — Therapy (Signed)
 OUTPATIENT PHYSICAL THERAPY LOWER EXTREMITY EVALUATION   Patient Name: Raymond Lewis MRN: [id number] DOB:07/07/1960, 63 y.o., male Today's Date: 11/09/2023  END OF SESSION:  PT End of Session - 11/09/23 0812     Visit Number 1    Date for Recertification  01/04/24    Authorization Type [place] Medicaid    PT Start Time 0815    PT Stop Time 0845    PT Time Calculation (min) 30 min          Past Medical History:  Diagnosis Date   Allergy    COVID-19    Depression    Pneumonia    History reviewed. No pertinent surgical history. Patient Active Problem List   Diagnosis Date Noted   BRBPR (bright red blood per rectum) 08/31/2023   Hypokalemia 08/31/2023   Acute colitis 08/29/2023   Uncontrolled type 2 diabetes mellitus with hyperglycemia, without long-term current use of insulin  (HCC) 08/29/2023   Acute respiratory failure with hypoxia (HCC) 02/26/2020   Hyperglycemia 02/26/2020   Hypoxia    Diabetes (HCC) 02/05/2020   AKI (acute kidney injury) 02/05/2020   Pneumonia due to COVID-19 virus 02/04/2020    PCP: Bascom Borer  REFERRING PROVIDER: Bascom Borer  REFERRING DIAG:  [phone] (ICD-10-CM) - Bilateral foot pain      THERAPY DIAG:  Bilateral foot pain  Neuropathy  Rationale for Evaluation and Treatment: Rehabilitation  ONSET DATE: 10/17/23 referral  SUBJECTIVE:   SUBJECTIVE STATEMENT: Burning, tingling and shooting pains in both feet. Can't remember when it start, but it was gradual and got worse.   PERTINENT HISTORY: See above  PAIN:  Are you having pain? Yes: NPRS scale: it varies but now it is 5/10 Pain location: both feet at the balls of my foot  Pain description: burning, numbness, tingling  Aggravating factors: haven't noticed anything yet, but standing or walking maybe  Relieving factors: soaking feet in baking soda  PRECAUTIONS: None  RED FLAGS: None   WEIGHT BEARING RESTRICTIONS: No  FALLS:  Has patient fallen in last 6  months? 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  DIAGNOSTIC FINDINGS: X-rays were obtained and reviewed bilaterally.  Multiple views obtained bilaterally.  There is no evidence of acute fracture.  Decreased calcaneal inclination angle.  COGNITION: Overall cognitive status: Within functional limits for tasks assessed     SENSATION: WFL  MUSCLE LENGTH: Tightness in calves and hamstrings  POSTURE: No Significant postural limitations  PALPATION: Majority discomfort is noted along the arch of the foot bilaterally as well as the dorsal midfoot there is no specific area pinpoint tenderness. Flexor, extensor tendons appear to be intact.   LOWER EXTREMITY ROM: full ROM   LOWER EXTREMITY MMT: 5/5   FUNCTIONAL TESTS:  5 times sit to stand: 17s  GAIT: Distance walked: in clinic distances Assistive device utilized: None Level of assistance: Complete Independence  TREATMENT DATE: 11/09/23 EVAL    PATIENT EDUCATION:  Education details: POC, HEP Person educated: Patient Education method: Medical illustrator Education comprehension: verbalized understanding, returned demonstration, and needs further education  HOME EXERCISE PROGRAM: Access Code: [facility] URL: [email or web address] Date: 11/09/2023 Prepared by: Almetta Fam  Exercises - Gastroc Stretch on Wall  - 1 x daily - 7 x weekly - 2 reps - 15 hold - Heel Raises with Counter Support  - 1 x daily - 7 x weekly - 2  sets - 10 reps - Heel Toe Raises with Counter Support  - 1 x daily - 7 x weekly - 2 sets - 10 reps - Towel Scrunches  - 1 x daily - 7 x weekly - 2 sets - 10 reps - Seated Plantar Fascia Stretch  - 1 x daily - 7 x weekly - 1 sets - 2 reps - 15 hold  ASSESSMENT:  CLINICAL IMPRESSION: Patient is a 63 y.o. male who was seen today for physical therapy evaluation and treatment for foot pain. He was 15 mins late for eval today so had to rush a little bit. He reports the pain in his feet happen regardless of him doing activity or doing nothing. The pain has been ongoing for about a year now. Patient describes his pain as numbness, tingling, and burning. It seems to be more nerve related and aligns with s/sx of neuropathy. He will benefit from skilled PT to find ways to address his pain to improve his overall QOL.   OBJECTIVE IMPAIRMENTS: pain.   ACTIVITY LIMITATIONS: locomotion level  PARTICIPATION LIMITATIONS: shopping and yard work  PERSONAL FACTORS: Time since onset of injury/illness/exacerbation are also affecting patient's functional outcome.   REHAB POTENTIAL: Good  CLINICAL DECISION MAKING: Stable/uncomplicated  EVALUATION COMPLEXITY: Low   GOALS: Goals reviewed with patient? Yes  SHORT TERM GOALS: Target date: 12/07/23  Patient will be independent with initial HEP. Baseline:  Goal status: INITIAL   LONG TERM GOALS: Target date: 01/04/24  Patient will be independent with advanced/ongoing HEP to improve outcomes and carryover.  Baseline:  Goal status: INITIAL  2.  Patient will report <2/10 in bilateral foot pain to improve QOL. Baseline: 5/10 Goal status: INITIAL  3.  Patient will report 50% decrease in burning, numbness, tingling in bilateral feet.  Baseline:  Goal status: INITIAL  4.  Patient will be able to ambulate long distances with normal gait pattern without increased foot pain to access community.  Baseline:  Goal status: INITIAL   PLAN:  PT FREQUENCY:  1x/week  PT DURATION: 8 weeks  PLANNED INTERVENTIONS: 97110-Therapeutic exercises, 97530- Therapeutic activity, [id number]- Neuromuscular re-education, 97535- Self Care, 02859- Manual therapy, [id number]- Gait training, [phone]- Electrical stimulation (manual), [id number]- Vasopneumatic device, [id number]- Ultrasound, [id number]- Ionotophoresis 4mg /ml Dexamethasone , 79439 (1-2 muscles), 20561 (3+ muscles)- Dry Needling, Patient/Family education, Balance training, Stair training, Taping, Joint mobilization, Cryotherapy, and Moist heat  PLAN FOR NEXT SESSION: address foot pain with strengthening, stretching, modalities    Almetta Fam, PT 11/09/2023, 8:46 AM

## 2023-11-08 NOTE — Progress Notes (Signed)
 11/08/2023 Name: Raymond Lewis MRN: [id number] DOB: November 16, 1960  Chief Complaint  Patient presents with   Diabetes    Raymond Lewis is a 63 y.o. year old male who presented for a telephone visit.   They were referred to the pharmacist by their PCP for assistance in managing diabetes. SABRA PMH includes T2DM.   Subjective: Patient was last seen by PCP, Raymond Borer, NP, on 10/17/23. At last visit, BP was 153/93 mmHg. Last A1C was 9.5%. Patient has not been interested in starting any medications.  Today, patient reports doing ok. He discussed at length his hx with uncontrolled BG - believing his pancreas function was worsened by steroids given to him during severe COVID infection in 2022. Discussed that at time of admission in 2022, his A1C was 7.8% and steroids could have contributed to worsened glycemic control. Patient is not wanting to start medications, but discussed LIBERATE study with him where he could gain access to 6 mo of CGM and he was interested.    Care Team: Primary Care Provider: Borer Raymond RAMAN, NP ; Next Scheduled Visit: 01/16/24  Medication Access/Adherence  Current Pharmacy:  [id number] [place], [place] - [address] AT [facility] [address] & [facility] [address] [place] [address] Phone: [phone] Fax: [phone]  [facility] [address] [place] [address] Phone: [phone] Fax: [phone]  [id number] [place], [place] - [address] AT [facility] OF [address] & [address] [place] [address] Phone: [phone] Fax: [phone]   Patient reports affordability concerns with their medications: Yes  - has Medicaid. Patient reports access/transportation concerns to their pharmacy: No  Patient reports adherence concerns with their medications:  Yes  - not willing to take medications at this time   Diabetes:  Current  medications: none Medications tried in the past: none  Current glucose readings: not checking  Patient denies hypoglycemic s/sx including dizziness, shakiness, sweating.   Current meal patterns: feels he would be able to get his BG down if he could resume his green shakes, but his blender is broken. He does not feel he could increase vegetable intake through other types of food preparation.   Current physical activity: did not discuss today  Current medication access support: Medicaid  Macrovascular and Microvascular Risk Reduction:  Statin? no; patient declined statin therapy; ACEi/ARB? No, patient declines medications at this time Last urinary albumin/creatinine ratio:  Lab Results  Component Value Date   MICRALBCREAT 4 09/05/2023   Last eye exam:   Last foot exam: No foot exam found Tobacco Use:  Tobacco Use: Low Risk  (10/31/2023)   Patient History    Smoking Tobacco Use: Never    Smokeless Tobacco Use: Never    Passive Exposure: Not on file    Objective:  BP Readings from Last 3 Encounters:  10/17/23 (!) 153/93  09/30/23 (!) 156/86  09/05/23 (!) 143/90    Lab Results  Component Value Date   HGBA1C 9.5 (H) 08/29/2023   HGBA1C 9.0 (A) 06/03/2023   HGBA1C 6.8 (A) 03/27/2020       Latest Ref Rng & Units 09/30/2023    9:37 PM 09/05/2023   11:51 AM 08/31/2023    3:42 AM  BMP  Glucose 70 - 99 mg/dL 732  791  811   BUN 8 - 23 mg/dL 7  8  9    Creatinine 0.61 -  1.24 mg/dL 9.17  9.08  9.23   BUN/Creat Ratio 10 - 24  9    Sodium 135 - 145 mmol/L 135  135  132   Potassium 3.5 - 5.1 mmol/L 4.2  4.1  3.2   Chloride 98 - 111 mmol/L 99  99  103   CO2 22 - 32 mmol/L 23  24  24    Calcium 8.9 - 10.3 mg/dL 9.7  9.7  7.9     Lab Results  Component Value Date   CHOL 154 06/03/2023   HDL 38 (L) 06/03/2023   LDLCALC 85 06/03/2023   TRIG 181 (H) 06/03/2023   CHOLHDL 4.1 06/03/2023    Medications Reviewed Today     Reviewed by Brinda Lorain SQUIBB, RPH (Pharmacist) on 11/08/23  at 1716  Med List Status: <None>   Medication Order Taking? Sig Documenting Provider Last Dose Status Informant  Accu-Chek Softclix Lancets lancets [id number]  USE AS DIRECTED TO TEST BLOOD SUGARS IN THE MORNING, NOON, AND AT BEDTIME Nichols, Tonya S, NP  Active   Blood Glucose Monitoring Suppl (BLOOD GLUCOSE MONITOR SYSTEM) w/Device KIT [id number]  USE AS DIRECTED TO TEST BLOOD SUGARS IN THE MORNING, NOON, AND AT BEDTIME Nichols, Tonya S, NP  Active   Blood Pressure KIT [id number]  1 Units by Does not apply route daily. Oley Raymond RAMAN, NP  Active   cyanocobalamin  (VITAMIN B12) 1000 MCG tablet [id number]  Take 1 tablet (1,000 mcg total) by mouth daily. Oley Raymond RAMAN, NP  Active Self, Pharmacy Records  Glucose Blood (BLOOD GLUCOSE TEST STRIPS) STRP [id number]  USE AS DIRECTED TO TEST BLOOD SUGARS IN THE [place], NOON, AND AT BEDTIME Nichols, Tonya S, NP  Active   Lancet Device MISC [id number]  1 each by Does not apply route in the morning, at noon, and at bedtime. May substitute to any manufacturer covered by patient's insurance. Oley Raymond RAMAN, NP  Active   Lancets Misc. MISC [id number]  1 each by Does not apply route in the morning, at noon, and at bedtime. May substitute to any manufacturer covered by patient's insurance. Oley Raymond RAMAN, NP  Active               Assessment/Plan:   Diabetes: - Currently uncontrolled with most recent A1C of 9.5% above goal <7%. Patient not willing to discuss medication therapy at this time. He would likely be eligible for LIBERATE study which could provide him information on how diet and lifestyle is affecting his BG. He is agreeable to scheduling an in person visit to potentially be enrolled in LIBERATE study. - Last UACR August 2025 - 4 mg/g - Reviewed long term cardiovascular and renal outcomes of uncontrolled blood sugar - Reviewed goal A1c, goal fasting, and goal 2 hour post prandial glucose - Reviewed dietary modifications including  utilizing the  healthy plate method, limiting portion size of carbohydrate foods, increasing intake of protein and non-starchy vegetables. Counseled patient to stay hydrated with water throughout the day. - Reviewed lifestyle modifications including: aiming for 150 minutes of moderate intensity exercise every week.  - Next A1C due 11/29/23    Written patient instructions provided. Patient verbalized understanding of treatment plan.   Follow Up Plan:  Pharmacist in person 11/15/23 (research visit) PCP clinic visit in 01/16/24   Lorain Brinda, PharmD [facility] [phone]

## 2023-11-09 ENCOUNTER — Ambulatory Visit: Attending: Nurse Practitioner

## 2023-11-09 DIAGNOSIS — G629 Polyneuropathy, unspecified: Secondary | ICD-10-CM | POA: Diagnosis present

## 2023-11-09 DIAGNOSIS — M79672 Pain in left foot: Secondary | ICD-10-CM | POA: Diagnosis present

## 2023-11-09 DIAGNOSIS — M79671 Pain in right foot: Secondary | ICD-10-CM | POA: Diagnosis present

## 2023-11-22 ENCOUNTER — Ambulatory Visit: Admitting: Physical Therapy

## 2023-11-22 DIAGNOSIS — M79671 Pain in right foot: Secondary | ICD-10-CM | POA: Diagnosis not present

## 2023-11-22 DIAGNOSIS — G629 Polyneuropathy, unspecified: Secondary | ICD-10-CM

## 2023-11-22 NOTE — Therapy (Signed)
 OUTPATIENT PHYSICAL THERAPY LOWER EXTREMITY EVALUATION   Patient Name: Jaikob Borgwardt MRN: [id number] DOB:03-27-1960, 63 y.o., male Today's Date: 11/22/2023  END OF SESSION:  PT End of Session - 11/22/23 0807     Visit Number 2    Date for Recertification  01/04/24    Authorization Type [place] Medicaid    PT Start Time 0807    PT Stop Time 0850    PT Time Calculation (min) 43 min          Past Medical History:  Diagnosis Date   Allergy    COVID-19    Depression    Pneumonia    No past surgical history on file. Patient Active Problem List   Diagnosis Date Noted   BRBPR (bright red blood per rectum) 08/31/2023   Hypokalemia 08/31/2023   Acute colitis 08/29/2023   Uncontrolled type 2 diabetes mellitus with hyperglycemia, without long-term current use of insulin  (HCC) 08/29/2023   Acute respiratory failure with hypoxia (HCC) 02/26/2020   Hyperglycemia 02/26/2020   Hypoxia    Diabetes (HCC) 02/05/2020   AKI (acute kidney injury) 02/05/2020   Pneumonia due to COVID-19 virus 02/04/2020    PCP: Bascom Borer  REFERRING PROVIDER: Bascom Borer  REFERRING DIAG:  [phone] (ICD-10-CM) - Bilateral foot pain      THERAPY DIAG:  Bilateral foot pain  Neuropathy  Rationale for Evaluation and Treatment: Rehabilitation  ONSET DATE: 10/17/23 referral  SUBJECTIVE:   SUBJECTIVE STATEMENT: 7 min late No issues with HEP Constant pain 5-10/10- all over,worse ball of foot and toes Trying to get in routine of soaking every day and I think that helps   Burning, tingling and shooting pains in both feet. Can't remember when it start, but it was gradual and got worse.   PERTINENT HISTORY: See above  PAIN:  Are you having pain? Yes: NPRS scale: it varies but now it is 5/10 Pain location: both feet at the balls of my foot  Pain description: burning, numbness, tingling  Aggravating factors: haven't noticed anything yet, but standing or walking maybe  Relieving  factors: soaking feet in baking soda  PRECAUTIONS: None  RED FLAGS: None   WEIGHT BEARING RESTRICTIONS: No  FALLS:  Has patient fallen in last 6 months? 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  DIAGNOSTIC FINDINGS: X-rays were obtained and reviewed bilaterally.  Multiple views obtained bilaterally.  There is no evidence of acute fracture.  Decreased calcaneal inclination angle.  COGNITION: Overall cognitive status: Within functional limits for tasks assessed     SENSATION: WFL  MUSCLE LENGTH: Tightness in calves and hamstrings  POSTURE: No Significant postural limitations  PALPATION: Majority discomfort is noted along the arch of the foot bilaterally as well as the dorsal midfoot there is no specific area pinpoint tenderness. Flexor, extensor tendons appear to be intact.   LOWER EXTREMITY ROM: full ROM   LOWER EXTREMITY MMT: 5/5   FUNCTIONAL TESTS:  5 times sit to stand: 17s  GAIT: Distance walked: in clinic distances Assistive device utilized: None Level of assistance: Complete Independence  TREATMENT DATE:   11/22/23 Nustep L 4 LE only Slant board stretch Black bar heel raise and toe raises 2 sets 10 Dyna disc SLS 10 x 4 ways- educ on how to do at home with pillow or folded blanket Step calf stretching for home Premod BIL feet 10 min for pain      11/09/23 EVAL    PATIENT EDUCATION:  Education details: POC, HEP Person educated: Patient Education method:  Medical illustrator Education comprehension: verbalized understanding, returned demonstration, and needs further education  HOME EXERCISE PROGRAM: Access Code: [facility] URL: [email or web address] Date: 11/09/2023 Prepared by: Almetta Fam  Exercises - Gastroc Stretch on Wall  - 1 x daily - 7 x weekly - 2 reps - 15 hold - Heel Raises with Counter Support  - 1 x daily - 7 x weekly - 2 sets - 10 reps - Heel Toe Raises with Counter Support  - 1 x daily - 7 x weekly - 2 sets - 10 reps - Towel Scrunches  - 1 x daily - 7 x weekly - 2 sets - 10 reps - Seated Plantar Fascia Stretch  - 1 x daily - 7 x weekly - 1 sets - 2 reps - 15 hold  ASSESSMENT:  CLINICAL IMPRESSION: pnt arrives with 5-10/10 pain constant. Verb doing HEP and starting foot soak. Also discussed acupunture slides and I explained it could be beneficial to mix up signal to brain of nerves similar to premod today ( educ if this helps we could look at TENS) Progressed ROM ex in clinic and educ for home    Patient is a 63 y.o. male who was seen today for physical therapy evaluation and treatment for foot pain. He was 15 mins late for eval today so had to rush a little bit. He reports the pain in his feet happen regardless of him doing activity or doing nothing. The pain has been ongoing for about a year now. Patient describes his pain as numbness, tingling, and burning. It seems to be more nerve related and aligns with s/sx of neuropathy. He will benefit from skilled PT to find ways to address his pain to improve his overall QOL.   OBJECTIVE IMPAIRMENTS: pain.   ACTIVITY LIMITATIONS: locomotion level  PARTICIPATION LIMITATIONS: shopping and yard work  PERSONAL FACTORS: Time since onset of injury/illness/exacerbation are also affecting patient's functional outcome.   REHAB POTENTIAL: Good  CLINICAL DECISION MAKING: Stable/uncomplicated  EVALUATION COMPLEXITY: Low   GOALS: Goals reviewed with patient?  Yes  SHORT TERM GOALS: Target date: 12/07/23  Patient will be independent with initial HEP. Baseline:  Goal status: 11/22/23 MET   LONG TERM GOALS: Target date: 01/04/24  Patient will be independent with advanced/ongoing HEP to improve outcomes and carryover.  Baseline:  Goal status: INITIAL  2.  Patient will report <2/10 in bilateral foot pain to improve QOL. Baseline: 5/10 Goal status: INITIAL  3.  Patient will report 50% decrease in burning, numbness, tingling in bilateral feet.  Baseline:  Goal status: INITIAL  4.  Patient will be able to ambulate long distances with normal gait pattern without increased foot pain to access community.  Baseline:  Goal status: INITIAL   PLAN:  PT FREQUENCY: 1x/week  PT DURATION: 8 weeks  PLANNED INTERVENTIONS: 97110-Therapeutic exercises, 97530- Therapeutic activity, [id number]- Neuromuscular re-education, 97535- Self Care, 02859- Manual therapy, [id number]- Gait training, [phone]- Electrical stimulation (manual), [id number]- Vasopneumatic device, [id number]- Ultrasound, [id number]- Ionotophoresis 4mg /ml Dexamethasone , 79439 (1-2 muscles), 20561 (3+ muscles)- Dry Needling, Patient/Family  education, Balance training, Stair training, Taping, Joint mobilization, Cryotherapy, and Moist heat  PLAN FOR NEXT SESSION: address foot pain with strengthening, stretching, modalities    Lynzie Cliburn,ANGIE, PTA 11/22/2023, 8:43 AM

## 2023-11-29 NOTE — Therapy (Signed)
 OUTPATIENT PHYSICAL THERAPY LOWER EXTREMITY TREATMENT   Patient Name: Raymond Lewis MRN: [id number] DOB:December 22, 1960, 63 y.o., male Today's Date: 11/30/2023  END OF SESSION:  PT End of Session - 11/30/23 0806     Visit Number 3    Date for Recertification  01/04/24    Authorization Type [place] [employer]    PT Start Time 0806    PT Stop Time 0845    PT Time Calculation (min) 39 min           Past Medical History:  Diagnosis Date   Allergy    COVID-19    Depression    Pneumonia    History reviewed. No pertinent surgical history. Patient Active Problem List   Diagnosis Date Noted   BRBPR (bright red blood per rectum) 08/31/2023   Hypokalemia 08/31/2023   Acute colitis 08/29/2023   Uncontrolled type 2 diabetes mellitus with hyperglycemia, without long-term current use of insulin  (HCC) 08/29/2023   Acute respiratory failure with hypoxia (HCC) 02/26/2020   Hyperglycemia 02/26/2020   Hypoxia    Diabetes (HCC) 02/05/2020   AKI (acute kidney injury) 02/05/2020   Pneumonia due to COVID-19 virus 02/04/2020    PCP: Bascom Borer  REFERRING PROVIDER: Bascom Borer  REFERRING DIAG:  [phone] (ICD-10-CM) - Bilateral foot pain      THERAPY DIAG:  Bilateral foot pain  Neuropathy  Rationale for Evaluation and Treatment: Rehabilitation  ONSET DATE: 10/17/23 referral  SUBJECTIVE:   SUBJECTIVE STATEMENT: The same, nothing has been helping so far. The electrical treatment was not good, it made my leg heat up and it has been doing it since then.    PERTINENT HISTORY: See above  PAIN:  Are you having pain? Yes: NPRS scale: it varies but now it is 5/10 Pain location: both feet at the balls of my foot  Pain description: burning, numbness, tingling  Aggravating factors: haven't noticed anything yet, but standing or walking maybe  Relieving factors: soaking feet in baking soda  PRECAUTIONS: None  RED FLAGS: None   WEIGHT BEARING RESTRICTIONS: No  FALLS:   Has patient fallen in last 6 months? 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  DIAGNOSTIC FINDINGS: X-rays were obtained and reviewed bilaterally.  Multiple views obtained bilaterally.  There is no evidence of acute fracture.  Decreased calcaneal inclination angle.  COGNITION: Overall cognitive status: Within functional limits for tasks assessed     SENSATION: WFL  MUSCLE LENGTH: Tightness in calves and hamstrings  POSTURE: No Significant postural limitations  PALPATION: Majority discomfort is noted along the arch of the foot bilaterally as well as the dorsal midfoot there is no specific area pinpoint tenderness. Flexor, extensor tendons appear to be intact.   LOWER EXTREMITY ROM: full ROM   LOWER EXTREMITY MMT: 5/5   FUNCTIONAL TESTS:  5 times sit to stand: 17s  GAIT: Distance walked: in clinic distances Assistive device utilized: None Level of assistance: Complete Independence  TREATMENT DATE:  11/30/23 Toe raises 2x10 Heel raises 2x10 Slant board stretch 20s x2 Bike L3 x24mins  Resisted gait 30# 4 way x4  Self stretch with rolling pin x2 mins each side  11/22/23 Nustep L 4 LE only Slant board stretch Black bar heel raise and toe raises 2 sets 10 Dyna disc SLS 10 x 4 ways- educ on how to do at home with pillow or folded blanket Step calf stretching for home Premod BIL feet 10 min for pain      11/09/23 EVAL    PATIENT EDUCATION:  Education details: POC,  HEP Person educated: Patient Education method: Medical Illustrator Education comprehension: verbalized understanding, returned demonstration, and needs further education  HOME EXERCISE PROGRAM: Access Code: [facility] URL: [email or web address] Date: 11/09/2023 Prepared by: Almetta Fam  Exercises - Gastroc Stretch on Wall  - 1 x daily - 7 x weekly - 2 reps - 15 hold - Heel Raises with Counter Support  - 1 x daily - 7 x weekly - 2 sets - 10 reps - Heel Toe Raises with Counter Support  - 1 x daily - 7 x weekly - 2 sets - 10 reps - Towel Scrunches  - 1 x daily - 7 x weekly - 2 sets - 10 reps - Seated Plantar Fascia Stretch  - 1 x daily - 7 x weekly - 1 sets - 2 reps - 15 hold  ASSESSMENT:  CLINICAL IMPRESSION: Patient arrives a few mins late. He reports his pain is unchanged and constant all day. We continued with some foot and ankle mobility and strengthening today. We discussed in length about neuropathy and how lifestyle modifications and medications can improve nerve function and decrease progression of it but there is no cure. He feels that there is not enough research and that he has been doing his own research and feels that there is something he can do to get rid of his pain. He also feels that all his symptoms began when he got severe Covid in 2022. Also educated him about the difference with musculoskeletal vs nerve pain. Patient reports rolling feet on massage pin was helpful, he will think about purchasing one for home. Will continue to make efforts to help with his feet pain.    Patient is a 63 y.o. male who was seen today for physical therapy evaluation and treatment for foot pain. He was 15 mins late for eval today so had to rush a little bit. He reports the pain in his feet happen regardless of him doing activity or doing nothing. The pain has been ongoing for about a year now. Patient describes his pain as numbness, tingling, and burning. It seems to be more  nerve related and aligns with s/sx of neuropathy. He will benefit from skilled PT to find ways to address his pain to improve his overall QOL.   OBJECTIVE IMPAIRMENTS: pain.   ACTIVITY LIMITATIONS: locomotion level  PARTICIPATION LIMITATIONS: shopping and yard work  PERSONAL FACTORS: Time since onset of injury/illness/exacerbation are also affecting patient's functional outcome.   REHAB POTENTIAL: Good  CLINICAL DECISION MAKING: Stable/uncomplicated  EVALUATION COMPLEXITY: Low   GOALS: Goals reviewed with patient? Yes  SHORT TERM GOALS: Target date: 12/07/23  Patient will be independent with initial HEP. Baseline:  Goal status: 11/22/23 MET   LONG TERM GOALS: Target date: 01/04/24  Patient will be independent with advanced/ongoing HEP to improve outcomes and carryover.  Baseline:  Goal status: INITIAL  2.  Patient will report <  2/10 in bilateral foot pain to improve QOL. Baseline: 5/10 Goal status: INITIAL  3.  Patient will report 50% decrease in burning, numbness, tingling in bilateral feet.  Baseline:  Goal status: INITIAL  4.  Patient will be able to ambulate long distances with normal gait pattern without increased foot pain to access community.  Baseline:  Goal status: INITIAL   PLAN:  PT FREQUENCY: 1x/week  PT DURATION: 8 weeks  PLANNED INTERVENTIONS: 97110-Therapeutic exercises, 97530- Therapeutic activity, [id number]- Neuromuscular re-education, 97535- Self Care, [id number]- Manual therapy, [id number]- Gait training, [phone]- Electrical stimulation (manual), [id number]- Vasopneumatic device, [id number]- Ultrasound, [id number]- Ionotophoresis 4mg /ml Dexamethasone , 79439 (1-2 muscles), 20561 (3+ muscles)- Dry Needling, Patient/Family education, Balance training, Stair training, Taping, Joint mobilization, Cryotherapy, and Moist heat  PLAN FOR NEXT SESSION: address foot pain with strengthening, stretching, modalities    Almetta Fam, PT 11/30/2023, 8:44 AM

## 2023-11-30 ENCOUNTER — Ambulatory Visit

## 2023-11-30 DIAGNOSIS — M79671 Pain in right foot: Secondary | ICD-10-CM

## 2023-11-30 DIAGNOSIS — G629 Polyneuropathy, unspecified: Secondary | ICD-10-CM

## 2023-12-06 ENCOUNTER — Other Ambulatory Visit: Payer: Self-pay

## 2023-12-06 NOTE — Therapy (Signed)
 OUTPATIENT PHYSICAL THERAPY LOWER EXTREMITY TREATMENT   Patient Name: Raymond Lewis MRN: [id number] DOB:1960/04/02, 63 y.o., male Today's Date: 12/07/2023  END OF SESSION:  PT End of Session - 12/07/23 0813     Visit Number 4    Date for Recertification  01/04/24    Authorization Type  Medicaid    PT Start Time 0813    PT Stop Time 0845    PT Time Calculation (min) 32 min            Past Medical History:  Diagnosis Date   Allergy    COVID-19    Depression    Pneumonia    History reviewed. No pertinent surgical history. Patient Active Problem List   Diagnosis Date Noted   BRBPR (bright red blood per rectum) 08/31/2023   Hypokalemia 08/31/2023   Acute colitis 08/29/2023   Uncontrolled type 2 diabetes mellitus with hyperglycemia, without long-term current use of insulin  (HCC) 08/29/2023   Acute respiratory failure with hypoxia (HCC) 02/26/2020   Hyperglycemia 02/26/2020   Hypoxia    Diabetes (HCC) 02/05/2020   AKI (acute kidney injury) 02/05/2020   Pneumonia due to COVID-19 virus 02/04/2020    PCP: Bascom Borer  REFERRING PROVIDER: Bascom Borer  REFERRING DIAG:  [phone] (ICD-10-CM) - Bilateral foot pain      THERAPY DIAG:  Bilateral foot pain  Neuropathy  Rationale for Evaluation and Treatment: Rehabilitation  ONSET DATE: 10/17/23 referral  SUBJECTIVE:   SUBJECTIVE STATEMENT: Feeling okay. My feet are the same.    PERTINENT HISTORY: See above  PAIN:  Are you having pain? Yes: NPRS scale: it varies but now it is 5/10 Pain location: both feet at the balls of my foot  Pain description: burning, numbness, tingling  Aggravating factors: haven't noticed anything yet, but standing or walking maybe  Relieving factors: soaking feet in baking soda  PRECAUTIONS: None  RED FLAGS: None   WEIGHT BEARING RESTRICTIONS: No  FALLS:  Has patient fallen in last 6 months? 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  DIAGNOSTIC FINDINGS: X-rays were obtained and reviewed bilaterally.  Multiple views obtained bilaterally.  There is no evidence of acute fracture.  Decreased calcaneal inclination angle.  COGNITION: Overall cognitive status: Within functional limits for tasks assessed     SENSATION: WFL  MUSCLE LENGTH: Tightness in calves and hamstrings  POSTURE: No Significant postural limitations  PALPATION: Majority discomfort is noted along the arch of the foot bilaterally as well as the dorsal midfoot there is no specific area pinpoint tenderness. Flexor, extensor tendons appear to be intact.   LOWER EXTREMITY ROM: full ROM   LOWER EXTREMITY MMT: 5/5   FUNCTIONAL TESTS:  5 times sit to stand: 17s  GAIT: Distance walked: in clinic distances Assistive device utilized: None Level of assistance: Complete Independence  TREATMENT DATE:  12/07/23 NuStep L6x70mins LE only On airex tapping cones Towel scrunches 2 mins  Self stretch on massage pin x2 mins each side Calf stretch 20s x2  Leg press 40# 2x10 Calf raises on leg press 2x15 Rocker board  11/30/23 Toe raises 2x10 Heel raises 2x10 Slant board stretch 20s x2 Bike L3 x86mins  Resisted gait 30# 4 way x4  Self stretch with rolling pin x2 mins each side  11/22/23 Nustep L 4 LE only Slant board stretch Black bar heel raise and toe raises 2 sets 10 Dyna disc SLS 10 x 4 ways- educ on how to do at home with pillow or folded blanket Step calf  stretching for home Premod BIL feet 10 min for pain      11/09/23 EVAL    PATIENT EDUCATION:  Education details: POC, HEP Person educated: Patient Education method: Medical Illustrator Education comprehension: verbalized understanding, returned demonstration, and needs further education  HOME EXERCISE PROGRAM: Access Code: [facility] URL: [email or web address] Date: 11/09/2023 Prepared by: Almetta Fam  Exercises - Gastroc Stretch on Wall  - 1 x daily - 7 x weekly - 2 reps - 15 hold - Heel Raises with Counter Support  - 1 x daily - 7 x weekly - 2 sets - 10 reps - Heel Toe Raises with Counter Support  - 1 x daily - 7 x weekly - 2 sets - 10 reps - Towel Scrunches  - 1 x daily - 7 x weekly - 2 sets - 10 reps - Seated Plantar Fascia Stretch  - 1 x daily - 7 x weekly - 1 sets - 2 reps - 15 hold  ASSESSMENT:  CLINICAL IMPRESSION: Patient arrives over 10 mins late. He reports his pain is unchanged and constant all day. We continued with some foot and ankle mobility and strengthening today. His pain seems to be more neuropathic in nature than musculoskeletal. Patient reports rolling feet on massage pin was helpful, he is thinking about purchasing one for home. Will continue to make efforts to help with his feet pain.    Patient is a 63 y.o. male who was seen today for physical therapy evaluation and treatment for foot pain. He was 15 mins late for eval today so had to rush a little bit. He reports the pain in his feet happen regardless of him doing activity or doing nothing. The pain has been ongoing for about a year now. Patient describes his pain as numbness, tingling, and burning. It seems to be more nerve related and aligns with s/sx of neuropathy. He will benefit from skilled PT to find ways to address his pain to improve his overall QOL.   OBJECTIVE IMPAIRMENTS: pain.   ACTIVITY LIMITATIONS: locomotion level  PARTICIPATION LIMITATIONS: shopping and yard  work  PERSONAL FACTORS: Time since onset of injury/illness/exacerbation are also affecting patient's functional outcome.   REHAB POTENTIAL: Good  CLINICAL DECISION MAKING: Stable/uncomplicated  EVALUATION COMPLEXITY: Low   GOALS: Goals reviewed with patient? Yes  SHORT TERM GOALS: Target date: 12/07/23  Patient will be independent with initial HEP. Baseline:  Goal status: 11/22/23 MET   LONG TERM GOALS: Target date: 01/04/24  Patient will be independent with advanced/ongoing HEP to improve outcomes and carryover.  Baseline:  Goal status: INITIAL  2.  Patient will report <2/10 in bilateral foot pain to improve QOL. Baseline: 5/10 Goal status: ongoing 12/07/23  3.  Patient will report 50% decrease in burning, numbness, tingling in bilateral feet.  Baseline:  Goal status: ongoing 12/07/23  4.  Patient will be able to ambulate long distances with normal gait pattern without increased foot pain to access community.  Baseline:  Goal status: INITIAL   PLAN:  PT FREQUENCY: 1x/week  PT DURATION: 8 weeks  PLANNED INTERVENTIONS: 97110-Therapeutic exercises, 97530- Therapeutic activity, [id number]- Neuromuscular re-education, 97535- Self Care, 02859- Manual therapy, [id number]- Gait training, [phone]- Electrical stimulation (manual), [id number]- Vasopneumatic device, [id number]- Ultrasound, [id number]- Ionotophoresis 4mg /ml Dexamethasone , 79439 (1-2 muscles), 20561 (3+ muscles)- Dry Needling, Patient/Family education, Balance training, Stair training, Taping, Joint mobilization, Cryotherapy, and Moist heat  PLAN FOR NEXT SESSION: address foot pain with strengthening, stretching, modalities    Almetta Fam, PT 12/07/2023, 8:55 AM

## 2023-12-07 ENCOUNTER — Ambulatory Visit: Attending: Nurse Practitioner

## 2023-12-07 ENCOUNTER — Ambulatory Visit: Payer: Self-pay | Admitting: Nurse Practitioner

## 2023-12-07 DIAGNOSIS — G629 Polyneuropathy, unspecified: Secondary | ICD-10-CM | POA: Diagnosis present

## 2023-12-07 DIAGNOSIS — M79671 Pain in right foot: Secondary | ICD-10-CM | POA: Diagnosis present

## 2023-12-07 DIAGNOSIS — M79672 Pain in left foot: Secondary | ICD-10-CM | POA: Diagnosis present

## 2023-12-14 ENCOUNTER — Ambulatory Visit

## 2023-12-23 ENCOUNTER — Ambulatory Visit

## 2023-12-23 NOTE — Therapy (Incomplete)
 OUTPATIENT PHYSICAL THERAPY LOWER EXTREMITY TREATMENT   Patient Name: Raymond Lewis MRN: [id number] DOB:08/24/1960, 63 y.o., male Today's Date: 12/23/2023  END OF SESSION:      Past Medical History:  Diagnosis Date   Allergy    COVID-19    Depression    Pneumonia    No past surgical history on file. Patient Active Problem List   Diagnosis Date Noted   BRBPR (bright red blood per rectum) 08/31/2023   Hypokalemia 08/31/2023   Acute colitis 08/29/2023   Uncontrolled type 2 diabetes mellitus with hyperglycemia, without long-term current use of insulin  (HCC) 08/29/2023   Acute respiratory failure with hypoxia (HCC) 02/26/2020   Hyperglycemia 02/26/2020   Hypoxia    Diabetes (HCC) 02/05/2020   AKI (acute kidney injury) 02/05/2020   Pneumonia due to COVID-19 virus 02/04/2020    PCP: Bascom Borer  REFERRING PROVIDER: Bascom Borer  REFERRING DIAG:  [phone] (ICD-10-CM) - Bilateral foot pain      THERAPY DIAG:  No diagnosis found.  Rationale for Evaluation and Treatment: Rehabilitation  ONSET DATE: 10/17/23 referral  SUBJECTIVE:   SUBJECTIVE STATEMENT: Feeling okay. My feet are the same.    PERTINENT HISTORY: See above  PAIN:  Are you having pain? Yes: NPRS scale: it varies but now it is 5/10 Pain location: both feet at the balls of my foot  Pain description: burning, numbness, tingling  Aggravating factors: haven't noticed anything yet, but standing or walking maybe  Relieving factors: soaking feet in baking soda  PRECAUTIONS: None  RED FLAGS: None   WEIGHT BEARING RESTRICTIONS: No  FALLS:  Has patient fallen in last 6 months? 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  DIAGNOSTIC FINDINGS: X-rays were obtained and reviewed bilaterally.  Multiple views obtained bilaterally.  There is no evidence of acute fracture.  Decreased calcaneal inclination angle.  COGNITION: Overall cognitive status: Within functional limits for tasks assessed     SENSATION: WFL  MUSCLE LENGTH: Tightness in calves and hamstrings  POSTURE: No Significant postural limitations  PALPATION: Majority discomfort is noted along the arch of the foot bilaterally as well as the dorsal midfoot there is no specific area pinpoint tenderness. Flexor, extensor tendons appear to be intact.   LOWER EXTREMITY ROM: full ROM   LOWER EXTREMITY MMT: 5/5   FUNCTIONAL TESTS:  5 times sit to stand: 17s  GAIT: Distance walked: in clinic distances Assistive device utilized: None Level of assistance: Complete Independence  TREATMENT DATE:  12/07/23 NuStep L6x26mins LE only On airex tapping cones Towel scrunches 2 mins  Self stretch on massage pin x2 mins each side Calf stretch 20s x2  Leg press 40# 2x10 Calf raises on leg press 2x15 Rocker board  11/30/23 Toe raises 2x10 Heel raises 2x10 Slant board stretch 20s x2 Bike L3 x2mins  Resisted gait 30# 4 way x4  Self stretch with rolling pin x2 mins each side  11/22/23 Nustep L 4 LE only Slant board stretch Black bar heel raise and toe raises 2 sets 10 Dyna disc SLS 10 x 4 ways- educ on how to do at home with pillow or folded blanket Step calf stretching for home Premod BIL feet 10 min for pain      11/09/23 EVAL    PATIENT EDUCATION:  Education details: POC, HEP Person educated: Patient Education method: Medical Illustrator Education comprehension: verbalized understanding,  returned demonstration, and needs further education  HOME EXERCISE PROGRAM: Access Code: [facility] URL: [email or web address] Date: 11/09/2023 Prepared by: Almetta Fam  Exercises - Gastroc Stretch on Wall  - 1 x daily - 7 x weekly - 2 reps - 15 hold - Heel Raises with Counter Support  - 1 x daily - 7 x weekly - 2 sets - 10 reps - Heel Toe Raises with Counter Support  - 1 x daily - 7 x weekly - 2 sets - 10 reps - Towel Scrunches  - 1 x daily - 7 x weekly - 2 sets - 10 reps - Seated Plantar Fascia Stretch  - 1 x daily - 7 x weekly - 1 sets - 2 reps - 15 hold  ASSESSMENT:  CLINICAL IMPRESSION: Patient arrives over 10 mins late. He reports his pain is unchanged and constant all day. We continued with some foot and ankle mobility and strengthening today. His pain seems to be more neuropathic in nature than musculoskeletal. Patient reports rolling feet on massage pin was helpful, he is thinking about purchasing one for home. Will continue to make efforts to help with his feet pain.    Patient is a 63 y.o. male who was seen today for physical therapy evaluation and treatment for foot pain. He was 15 mins late for eval today so had to rush a little bit. He reports the pain in his feet happen regardless of him doing activity or doing nothing. The pain has been ongoing for about a year now. Patient describes his pain as numbness, tingling, and burning. It seems to be more nerve related and aligns with s/sx of neuropathy. He will benefit from skilled PT to find ways to address his pain to improve his overall QOL.   OBJECTIVE IMPAIRMENTS: pain.   ACTIVITY LIMITATIONS: locomotion level  PARTICIPATION LIMITATIONS: shopping and yard work  PERSONAL FACTORS: Time since onset of injury/illness/exacerbation are also affecting patient's functional outcome.   REHAB POTENTIAL: Good  CLINICAL DECISION MAKING: Stable/uncomplicated  EVALUATION COMPLEXITY: Low   GOALS: Goals reviewed with  patient? Yes  SHORT TERM GOALS: Target date: 12/07/23  Patient will be independent with initial HEP. Baseline:  Goal status: 11/22/23 MET   LONG TERM GOALS: Target date: 01/04/24  Patient will be independent with advanced/ongoing HEP to improve outcomes and carryover.  Baseline:  Goal status: INITIAL  2.  Patient will report <2/10 in bilateral foot pain to improve QOL. Baseline: 5/10 Goal status: ongoing 12/07/23  3.  Patient will report 50% decrease in burning, numbness, tingling in bilateral feet.  Baseline:  Goal status: ongoing 12/07/23  4.  Patient will be able to ambulate long distances with normal gait pattern without increased foot pain to access community.  Baseline:  Goal status: INITIAL   PLAN:  PT FREQUENCY: 1x/week  PT DURATION: 8 weeks  PLANNED INTERVENTIONS: 97110-Therapeutic exercises, 97530- Therapeutic activity, [id number]- Neuromuscular re-education, 97535- Self Care, 02859- Manual therapy, [id number]- Gait training, [phone]- Electrical stimulation (manual), [id number]- Vasopneumatic device, [id number]- Ultrasound, [id number]- Ionotophoresis 4mg /ml Dexamethasone , 79439 (1-2 muscles), 20561 (3+ muscles)- Dry Needling, Patient/Family education, Balance training, Stair training, Taping, Joint mobilization, Cryotherapy, and Moist heat  PLAN FOR NEXT SESSION: address foot pain with strengthening, stretching, modalities    Malaisha Silliman L Jahzaria Vary, PT 12/23/2023, 7:52 AM

## 2024-01-16 ENCOUNTER — Ambulatory Visit: Payer: Self-pay | Admitting: Nurse Practitioner

## 2024-02-20 ENCOUNTER — Ambulatory Visit: Admitting: Nurse Practitioner
